# Patient Record
Sex: Male | Born: 1965 | Race: White | Hispanic: No | Marital: Married | Smoking: Current every day smoker
Health system: Southern US, Community
[De-identification: ages and names within clinical notes are randomized; demographics above are authoritative.]

## PROBLEM LIST (undated history)

## (undated) DIAGNOSIS — G4730 Sleep apnea, unspecified: Secondary | ICD-10-CM

## (undated) DIAGNOSIS — C801 Malignant (primary) neoplasm, unspecified: Secondary | ICD-10-CM

## (undated) DIAGNOSIS — R12 Heartburn: Secondary | ICD-10-CM

## (undated) DIAGNOSIS — R972 Elevated prostate specific antigen [PSA]: Secondary | ICD-10-CM

## (undated) DIAGNOSIS — E785 Hyperlipidemia, unspecified: Secondary | ICD-10-CM

## (undated) DIAGNOSIS — I10 Essential (primary) hypertension: Secondary | ICD-10-CM

## (undated) DIAGNOSIS — F17200 Nicotine dependence, unspecified, uncomplicated: Secondary | ICD-10-CM

## (undated) DIAGNOSIS — G709 Myoneural disorder, unspecified: Secondary | ICD-10-CM

## (undated) DIAGNOSIS — F5104 Psychophysiologic insomnia: Secondary | ICD-10-CM

## (undated) DIAGNOSIS — G4710 Hypersomnia, unspecified: Secondary | ICD-10-CM

## (undated) DIAGNOSIS — K449 Diaphragmatic hernia without obstruction or gangrene: Secondary | ICD-10-CM

## (undated) DIAGNOSIS — K2970 Gastritis, unspecified, without bleeding: Secondary | ICD-10-CM

## (undated) DIAGNOSIS — R7303 Prediabetes: Secondary | ICD-10-CM

## (undated) DIAGNOSIS — K219 Gastro-esophageal reflux disease without esophagitis: Secondary | ICD-10-CM

## (undated) DIAGNOSIS — Z8719 Personal history of other diseases of the digestive system: Secondary | ICD-10-CM

## (undated) DIAGNOSIS — K589 Irritable bowel syndrome without diarrhea: Secondary | ICD-10-CM

## (undated) HISTORY — DX: Heartburn: R12

## (undated) HISTORY — DX: Irritable bowel syndrome, unspecified: K58.9

## (undated) HISTORY — PX: OTHER SURGICAL HISTORY: SHX169

## (undated) HISTORY — DX: Psychophysiologic insomnia: F51.04

## (undated) HISTORY — DX: Diaphragmatic hernia without obstruction or gangrene: K44.9

## (undated) HISTORY — DX: Gastro-esophageal reflux disease without esophagitis: K21.9

## (undated) HISTORY — PX: WISDOM TOOTH EXTRACTION: SHX21

## (undated) HISTORY — DX: Sleep apnea, unspecified: G47.30

## (undated) HISTORY — DX: Hyperlipidemia, unspecified: E78.5

## (undated) HISTORY — DX: Hypersomnia, unspecified: G47.10

## (undated) HISTORY — PX: PROSTATE BIOPSY: SHX241

## (undated) HISTORY — DX: Gastritis, unspecified, without bleeding: K29.70

---

## 1995-01-12 HISTORY — PX: OTHER SURGICAL HISTORY: SHX169

## 2000-10-31 ENCOUNTER — Encounter: Payer: Self-pay | Admitting: Emergency Medicine

## 2000-10-31 ENCOUNTER — Emergency Department (HOSPITAL_COMMUNITY): Admission: EM | Admit: 2000-10-31 | Discharge: 2000-10-31 | Payer: Self-pay | Admitting: Emergency Medicine

## 2001-04-10 ENCOUNTER — Encounter (INDEPENDENT_AMBULATORY_CARE_PROVIDER_SITE_OTHER): Payer: Self-pay | Admitting: *Deleted

## 2001-04-10 ENCOUNTER — Ambulatory Visit (HOSPITAL_BASED_OUTPATIENT_CLINIC_OR_DEPARTMENT_OTHER): Admission: RE | Admit: 2001-04-10 | Discharge: 2001-04-10 | Payer: Self-pay | Admitting: Surgery

## 2007-07-31 ENCOUNTER — Encounter: Payer: Self-pay | Admitting: Internal Medicine

## 2007-10-25 ENCOUNTER — Encounter: Payer: Self-pay | Admitting: Internal Medicine

## 2008-10-22 ENCOUNTER — Encounter: Payer: Self-pay | Admitting: Internal Medicine

## 2008-10-24 ENCOUNTER — Encounter: Payer: Self-pay | Admitting: Internal Medicine

## 2009-02-13 ENCOUNTER — Encounter: Payer: Self-pay | Admitting: Internal Medicine

## 2009-02-18 ENCOUNTER — Encounter: Payer: Self-pay | Admitting: Internal Medicine

## 2009-04-08 ENCOUNTER — Ambulatory Visit: Payer: Self-pay | Admitting: Internal Medicine

## 2009-04-08 DIAGNOSIS — R5383 Other fatigue: Secondary | ICD-10-CM

## 2009-04-08 DIAGNOSIS — E785 Hyperlipidemia, unspecified: Secondary | ICD-10-CM

## 2009-04-08 DIAGNOSIS — R5381 Other malaise: Secondary | ICD-10-CM

## 2009-04-08 DIAGNOSIS — F17200 Nicotine dependence, unspecified, uncomplicated: Secondary | ICD-10-CM

## 2009-04-24 ENCOUNTER — Telehealth (INDEPENDENT_AMBULATORY_CARE_PROVIDER_SITE_OTHER): Payer: Self-pay | Admitting: *Deleted

## 2009-05-15 ENCOUNTER — Ambulatory Visit: Payer: Self-pay

## 2009-05-15 ENCOUNTER — Ambulatory Visit: Payer: Self-pay | Admitting: Internal Medicine

## 2009-05-15 ENCOUNTER — Ambulatory Visit: Payer: Self-pay | Admitting: Pulmonary Disease

## 2009-05-15 DIAGNOSIS — G4730 Sleep apnea, unspecified: Secondary | ICD-10-CM

## 2009-05-15 DIAGNOSIS — G4710 Hypersomnia, unspecified: Secondary | ICD-10-CM

## 2009-06-16 ENCOUNTER — Ambulatory Visit (HOSPITAL_BASED_OUTPATIENT_CLINIC_OR_DEPARTMENT_OTHER): Admission: RE | Admit: 2009-06-16 | Discharge: 2009-06-16 | Payer: Self-pay | Admitting: Pulmonary Disease

## 2009-06-16 ENCOUNTER — Encounter: Payer: Self-pay | Admitting: Pulmonary Disease

## 2009-06-20 ENCOUNTER — Ambulatory Visit: Payer: Self-pay | Admitting: Pulmonary Disease

## 2009-06-30 ENCOUNTER — Ambulatory Visit: Payer: Self-pay | Admitting: Pulmonary Disease

## 2009-10-17 ENCOUNTER — Telehealth: Payer: Self-pay | Admitting: Pulmonary Disease

## 2010-02-10 NOTE — Assessment & Plan Note (Signed)
Summary: rov- 2 wks after slp study per rc //kp   Visit Type:  Follow-up Copy to:  Dr. Gala Romney Primary Provider/Referring Provider:  Dr Tenny Craw @ [facility]  CC:  Pt here for follow up after sleep study. Pt states is on an antibiotice for ingrown toenail and will finish same on tomorrow.  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 He tosses and turns all night. Snores heavily and wife kicks him out of bed most nights. Wife has noted that  he often stops breathing. He drives 4-500 miles/wk. Epworth Sleepiness Score is 16. On weekends he naps x 1 hr, non refreshing.  June 30, 2009 3:06 PM  reviewed PSG , wt 200 lbs >>mild obstructive sleep apnea with [facility] 12/h , predominant hypopneas in supine position, non supine AHI was 2/h (59 mins) , sleep fragmentation due to multiple spontaneous arousals.  Preventive Screening-Counseling & Management  Alcohol-Tobacco     Smoking Status: current  Current Medications (verified): 1)  Simvastatin 40 Mg Tabs (Simvastatin) .... Take One Tablet By Mouth Daily At Bedtime 2)  Omeprazole 20 Mg Cpdr (Omeprazole) .... Once Daily 3)  Multivitamins   Tabs (Multiple Vitamin) .... Once Daily 4)  Claritin 10 Mg Tabs (Loratadine) .... 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  1/2 pdd x 20 yrs Alcohol Use - yes Regular Exercise - no Drug Use - yes --- 10+ yrs ago cocaine ( very few times) Married Children-yes 1  Review of Systems  The patient denies anorexia, fever, weight loss, weight gain, vision loss, decreased hearing, hoarseness, chest pain, syncope, dyspnea on exertion,  peripheral edema, prolonged cough, headaches, hemoptysis, abdominal pain, melena, hematochezia, severe indigestion/heartburn, hematuria, muscle weakness, suspicious skin lesions, difficulty walking, depression, unusual weight change, and abnormal bleeding.    Vital Signs:  Patient profile:   45 year old male Height:      68 inches Weight:      200.5 pounds BMI:     30.60 O2 Sat:      97 % on Room air Temp:     98.2 degrees F oral Pulse rate:   90 / minute BP sitting:   112 / 84  (left arm) Cuff size:   regular  Vitals Entered By: Zackery Barefoot CMA (June 30, 2009 2:48 PM)  O2 Flow:  Room air CC: Pt here for follow up after sleep study. Pt states is on an antibiotice for ingrown toenail and will finish same on tomorrow Comments Medications reviewed with patient Verified contact number and pharmacy with patient Zackery Barefoot CMA  June 30, 2009 2:49 PM    Physical Exam  Additional Exam:  Gen. Pleasant, well-nourished, in no distress, normal affect ENT - no lesions, no post nasal drip, class 3 airway Neck: No JVD, no thyromegaly, no carotid bruits Lungs: no use of accessory muscles, no dullness to percussion, clear without rales or rhonchi  Cardiovascular: Rhythm regular, heart sounds  normal, no murmurs or gallops, no peripheral edema Musculoskeletal: No deformities, no cyanosis or clubbing      Impression & Recommendations:  Problem # 1:  HYPERSOMNIA, ASSOCIATED WITH SLEEP APNEA (ICD-780.53) The pathophysiology of obstructive sleep apnea, it's  cardiovascular consequences and modes of treatment including CPAP were discussed with the patient in great detail.  Discussed options including positional device, CPAP & oral appliance Clearly weight loss should be primar goal. he will try positional device & we can rpt home study on this & review for symptoms in 1 month Orders: DME Referral (DME) Est. Patient Level III (81191)  Problem # 2:  FATIGUE (ICD-780.79) melatonin at  bedtime   Patient Instructions: 1)  Copy sent to: 2)  Please schedule a follow-up appointment in 6  weeks. 3)  Trial of melatonin upto 5 mg 2 hrs before bedtime 4)  Discuss co-pays on CPAP with PCC 5)  trial of sleeping device - [email or web address]

## 2010-02-10 NOTE — Progress Notes (Signed)
  Faxed ROI over to [facility] today to fax [phone] [facility]  April 24, 2009 10:20 AM    Appended Document:  Recieved Records back from [facility] @ [facility] gave to [employer]

## 2010-02-10 NOTE — Letter (Signed)
Summary: [facility] Office Note  [facility] Office Note   Imported By: Roderic Ovens 06/02/2009 11:35:07  _____________________________________________________________________  External Attachment:    Type:   Image     Comment:   External Document

## 2010-02-10 NOTE — Progress Notes (Signed)
----   Converted from flag ---- ---- 07/08/2009 2:21 PM, Boone Master CNA/MA wrote: called spoke with patient.  he states that he has not tried the positional device and is waiting until he gets more information from his insurance on what is covered, etc before making a decision on whether or not to move forward.  pt states also that when he talked to [facility] regarding co-pays on CPAP he was told that most insurances pay about 80%, but not knowing the "80% of what" is holding him back.    ---- 07/08/2009 11:11 AM, Boone Master CNA/MA wrote: [facility] @ work # [employer].  gave pt [employer] and [facility] office numbers.  ---- 07/08/2009 11:02 AM, Comer Locket. Vassie Loll MD wrote: can u find out he has obtained the positional device ? ------------------------------

## 2010-02-10 NOTE — Assessment & Plan Note (Signed)
Summary: np eval fx of heart disease   Primary Provider:  Dr Tenny Craw @ [facility]  CC:  lipids.  History of Present Illness: 45 y/o male with h/o hyperlipidemia and tobacco use. Referred for a baseline cardiology evaluation.  No known CAD. has never had stress test or cath.  Not very active. 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 Denies any exertional CP or SOB. Has been smoking about 1/2 ppd x 20 years. Has not tried to quit. Main complaint is that he feels tired. has problems sleeping - says "I can't sleep at all." Toss and turns all night. Snores heavily and wife kicks him out of bed most nights. Wife says he often stops breathing.  Occasional brief palpitations. No syncope or presynocpe. Has been on simvastatin for nearly 2 years.     Preventive Screening-Counseling & Management  Alcohol-Tobacco     Smoking Status: current  Caffeine-Diet-Exercise     Does Patient Exercise: no      Drug Use:  yes.    Current Medications (verified): 1)  Simvastatin 40 Mg Tabs (Simvastatin) .... Take One Tablet By Mouth Daily At Bedtime 2)  Omeprazole 20 Mg Cpdr (Omeprazole) .... Once Daily 3)  Multivitamins   Tabs (Multiple Vitamin) .... Once Daily  Allergies (verified): No Known Drug Allergies  Past History:  Past Medical History: Hyperlipidemia Tobacco user Insomina GERD  Family History: Reviewed history from 04/04/2009 and no changes required. Family History of Diabetes:  Family History of Hyperlipidemia:  M  alive 25 good shape F  died 74. Multiple problems. Possible MI 1B 42 fine  Social History: Reviewed history and no changes required. Full Time Tobacco Use - Yes.  1/2 pdd x 20 yrs Alcohol Use - yes Regular Exercise - no Drug Use - yes --- 10+ yrs ago cocaine ( very few times) Smoking Status:  current Does Patient Exercise:  no Drug Use:  yes  Review of Systems       As per HPI and past medical history; otherwise all systems  negative.   Vital Signs:  Patient profile:   46 year old male Height:      68 inches Weight:      203 pounds BMI:     30.98 Pulse rate:   68 / minute BP sitting:   120 / 86  (left arm) Cuff size:   regular  Vitals Entered By: Hardin Negus, RMA (April 08, 2009 9:14 AM)  Physical Exam  General:  Gen: well appearing. no resp difficulty HEENT: normal. Mallinpati 3 Neck: supple. no JVD. Carotids 2+ bilat; no bruits. No lymphadenopathy or thryomegaly appreciated. Cor: PMI nondisplaced. Regular rate & rhythm. No rubs, gallops, murmur. Lungs: clear Abdomen: soft, nontender, nondistended. No hepatosplenomegaly. No bruits or masses. Good bowel sounds. Extremities: no cyanosis, clubbing, rash, edema Neuro: alert & orientedx3, cranial nerves grossly intact. moves all 4 extremities w/o difficulty. affect pleasant    Problems:  Medical Problems Added: 1)  Dx of Tobacco User  (ICD-305.1) 2)  Dx of Screening For Ischemic Heart Disease  (ICD-V81.0) 3)  Dx of Fatigue  (ICD-780.79) 4)  Dx of Fatigue / Malaise  (ICD-780.79) 5)  Dx of Hyperlipidemia-mixed  (ICD-272.4)  Impression & Recommendations:  Problem # 1:  SCREENING FOR ISCHEMIC HEART DISEASE (ICD-V81.0) Has multiple risk factors which we discussed but no overt symptoms. Will proceed with screening exercise test.  Problem # 2:  FATIGUE (ICD-780.79) Almost certainly has severe sleep apnea. We discussed pathophyisology of this and  increased risk of CV disease associated with it. Refert for sleep study.  Problem # 3:  TOBACCO USER (ICD-305.1) Counseled on need to stop smoking.  Other Orders: EKG w/ Interpretation (93000) Treadmill (Treadmill) Pulmonary Referral (Pulmonary)  Patient Instructions: 1)  You have been referred to Pulmonary 2)  Your physician has requested that you have an exercise tolerance test.  For further information please visit [email or web address]  Please also follow instruction sheet, as given. 3)  Follow  up in 3-4 months

## 2010-02-10 NOTE — Letter (Signed)
Summary: [facility]  [facility]   Imported By: Roderic Ovens 06/02/2009 11:28:51  _____________________________________________________________________  External Attachment:    Type:   Image     Comment:   External Document

## 2010-02-10 NOTE — Letter (Signed)
Summary: [facility]  [facility]   Imported By: Roderic Ovens 04/14/2009 15:54:47  _____________________________________________________________________  External Attachment:    Type:   Image     Comment:   External Document

## 2010-02-10 NOTE — Letter (Signed)
[facility]  [facility]   Imported By: Roderic Ovens 04/14/2009 15:57:00  _____________________________________________________________________  External Attachment:    Type:   Image     Comment:   External Document

## 2010-02-10 NOTE — Letter (Signed)
Summary: [facility]  [facility]   Imported By: Roderic Ovens 06/02/2009 11:29:46  _____________________________________________________________________  External Attachment:    Type:   Image     Comment:   External Document

## 2010-02-10 NOTE — Assessment & Plan Note (Signed)
Summary: sleep eval/apc   Visit Type:  Initial Consult Copy to:  Dr. Gala Romney Primary Provider/Referring Provider:  Dr Tenny Craw @ [facility]  CC:  Pt here for sleep consult.  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 He tosses and turns all night. Snores heavily and wife kicks him out of bed most nights. Wife has noted that  he often stops breathing. He drives 4-500 miles/wk. Epworth Sleepiness Score is 16. On weekends he naps x 1 hr, non refreshing.  Preventive Screening-Counseling & Management  Alcohol-Tobacco     Alcohol drinks/day: 2     Alcohol type: beer     Smoking Status: current     Packs/Day: 1.0     Year Started: 1995   History of Present Illness: Can only sleep for a short time  What time do you typically go to bed?(between what hours): 10pm -11pm  How long does it take you to fall asleep? no time  How many times during the night do you wake up? many at least 3 or 4  What time do you get out of bed to start your day? 7am, tired, dry mouth  Do you drive or operate heavy machinery in your occupation? 400 to 500 miles per week in car for work  How much has your weight changed (up or down) over the past two years? (in pounds): 5 lb increase  Have you ever had a sleep study before?  If yes,when and where: no  Do you currently use CPAP ? If so , at what pressure? no  Do you wear oxygen at any time? If yes, how many liters per minute? no Current Medications (verified): 1)  Simvastatin 40 Mg Tabs (Simvastatin) .... Take One Tablet By Mouth Daily At Bedtime 2)  Omeprazole 20 Mg Cpdr (Omeprazole) .... Once Daily 3)  Multivitamins   Tabs (Multiple Vitamin) .... Once Daily 4)  Claritin 10 Mg Tabs (Loratadine) .... 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  1/2 pdd x 20 yrs Alcohol Use - yes Regular Exercise - no Drug Use - yes --- 10+ yrs ago cocaine ( very few times) Married Children-yes 1Packs/Day:  1.0 Alcohol drinks/day:  2  Review of Systems       The patient complains of productive cough, acid heartburn, indigestion, weight change, nasal congestion/difficulty breathing through nose, sneezing, itching, anxiety, and joint stiffness or pain.  The patient denies shortness of breath with activity, shortness of breath at rest, non-productive cough, coughing up blood, chest pain, irregular heartbeats, loss of appetite, abdominal pain, difficulty swallowing, sore throat, tooth/dental problems, headaches, ear ache, depression, hand/feet swelling, rash, change in color of mucus, and fever.    Vital Signs:  Patient profile:   45 year old male Height:      68 inches Weight:      197 pounds O2 Sat:      97 % on Room air Temp:     97.9 degrees F oral Pulse rate:   91 / minute BP sitting:   116 / 74  (right arm) Cuff size:   regular  Vitals Entered By: Zackery Barefoot CMA (May 15, 2009 10:14 AM)  O2 Flow:  Room air CC: Pt here  for sleep consult Comments Medications reviewed with patient Verified contact number and pharmacy with patient Zackery Barefoot CMA  May 15, 2009 10:14 AM    Physical Exam  Additional Exam:  Gen. Pleasant, well-nourished, in no distress, normal affect ENT - no lesions, no post nasal drip, class 3 airway Neck: No JVD, no thyromegaly, no carotid bruits Lungs: no use of accessory muscles, no dullness to percussion, clear without rales or rhonchi  Cardiovascular: Rhythm regular, heart sounds  normal, no murmurs or gallops, no peripheral edema Abdomen: soft and non-tender, no hepatosplenomegaly, BS normal. Musculoskeletal: No deformities, no cyanosis or clubbing Neuro:  alert, non focal     Impression &  Recommendations:  Problem # 1:  HYPERSOMNIA, ASSOCIATED WITH SLEEP APNEA (ICD-780.53) The pathophysiology of obstructive sleep apnea, it's cardiovascular consequences and modes of treatment including CPAP were discussed with the patient in great detail.  Given excessive daytime somnolence, loud snoring, witnessed apneas & narrow pharyngeal exam, obstructive sleep apnea is very likley & a split study willbe scheduled. Orders: Consultation Level III (04540) Sleep Disorder Referral (Sleep Disorder)  Medications Added to Medication List This Visit: 1)  Claritin 10 Mg Tabs (Loratadine) .... Take 1 tablet by mouth once a day as needed seasonal  Patient Instructions: 1)  Copy sent to: dr Gala Romney 2)  Please schedule a follow-up appointment in 2 weeks after sleep study

## 2012-03-22 ENCOUNTER — Other Ambulatory Visit: Payer: Self-pay | Admitting: Gastroenterology

## 2012-03-27 ENCOUNTER — Other Ambulatory Visit: Payer: Self-pay

## 2012-03-27 ENCOUNTER — Encounter (HOSPITAL_COMMUNITY): Payer: Self-pay

## 2012-03-27 ENCOUNTER — Ambulatory Visit (HOSPITAL_COMMUNITY)
Admission: RE | Admit: 2012-03-27 | Discharge: 2012-03-27 | Disposition: A | Discharge status: Home / Self Care | Payer: 59 | Source: Ambulatory Visit | Attending: Gastroenterology | Admitting: Gastroenterology

## 2012-03-27 DIAGNOSIS — K5730 Diverticulosis of large intestine without perforation or abscess without bleeding: Secondary | ICD-10-CM | POA: Insufficient documentation

## 2012-03-27 DIAGNOSIS — R197 Diarrhea, unspecified: Secondary | ICD-10-CM | POA: Insufficient documentation

## 2012-03-27 DIAGNOSIS — R1031 Right lower quadrant pain: Secondary | ICD-10-CM | POA: Insufficient documentation

## 2012-03-27 MED ORDER — IOHEXOL 300 MG/ML  SOLN
100.0000 mL | Freq: Once | INTRAMUSCULAR | Status: AC | PRN
  Administered 2012-03-27: 100 mL via INTRAVENOUS

## 2013-01-31 ENCOUNTER — Encounter: Payer: Self-pay | Admitting: Cardiology

## 2013-01-31 ENCOUNTER — Encounter: Payer: Self-pay | Admitting: *Deleted

## 2013-02-01 ENCOUNTER — Encounter: Payer: Self-pay | Admitting: Cardiology

## 2013-02-01 ENCOUNTER — Ambulatory Visit (INDEPENDENT_AMBULATORY_CARE_PROVIDER_SITE_OTHER): Payer: 59 | Admitting: Cardiology

## 2013-02-01 VITALS — BP 138/90 | HR 78 | Ht 68.0 in | Wt 203.0 lb

## 2013-02-01 DIAGNOSIS — R079 Chest pain, unspecified: Secondary | ICD-10-CM

## 2013-02-01 DIAGNOSIS — F17200 Nicotine dependence, unspecified, uncomplicated: Secondary | ICD-10-CM

## 2013-02-01 DIAGNOSIS — E785 Hyperlipidemia, unspecified: Secondary | ICD-10-CM

## 2013-02-01 NOTE — Assessment & Plan Note (Addendum)
Management per primary care. 

## 2013-02-01 NOTE — Assessment & Plan Note (Signed)
Patient counseled on discontinuing. 

## 2013-02-01 NOTE — Progress Notes (Signed)
     HPI: 48 year old male for evaluation of chest pain. Exercise treadmill in May 2011 negative. Patient has occasional indigestion that improves with Prilosec. This can occur after eating. He denies dyspnea on exertion, orthopnea, PND, pedal edema, syncope or exertional chest pain.  Current Outpatient Prescriptions  Medication Sig Dispense Refill  . aspirin 81 MG tablet Take 81 mg by mouth daily.      . Omega-3 Fatty Acids (FISH OIL PO) Take 1 tablet by mouth daily.      . temazepam (RESTORIL) 15 MG capsule Take 15 mg by mouth at bedtime as needed for sleep.      Marland Kitchen. omeprazole (PRILOSEC) 40 MG capsule Take 2 capsules by mouth daily.       No current facility-administered medications for this visit.    Not on File  Past Medical History  Diagnosis Date  . HYPERLIPIDEMIA-MIXED   . HYPERSOMNIA, ASSOCIATED WITH SLEEP APNEA   . GERD (gastroesophageal reflux disease)     Past Surgical History  Procedure Laterality Date  . Athroscopic knee surgery      History   Social History  . Marital Status: Married    Spouse Name: N/A    Number of Children: 1  . Years of Education: N/A   Occupational History  . Not on file.   Social History Main Topics  . Smoking status: Current Every Day Smoker  . Smokeless tobacco: Not on file  . Alcohol Use: Yes     Comment: Case and half beer per week  . Drug Use: No  . Sexual Activity: Not on file   Other Topics Concern  . Not on file   Social History Narrative  . No narrative on file    Family History  Problem Relation Age of Onset  . CAD Father     MI at age 47    ROS: no fevers or chills, productive cough, hemoptysis, dysphasia, odynophagia, melena, hematochezia, dysuria, hematuria, rash, seizure activity, orthopnea, PND, pedal edema, claudication. Remaining systems are negative.  Physical Exam:   Blood pressure 138/90, pulse 78, height 5\' 8"  (1.727 m), weight 203 lb (92.08 kg).  General:  Well developed/well nourished in  NAD Skin warm/dry Patient not depressed No peripheral clubbing Back-normal HEENT-normal/normal eyelids Neck supple/normal carotid upstroke bilaterally; no bruits; no JVD; no thyromegaly chest - CTA/ normal expansion CV - RRR/normal S1 and S2; no murmurs, rubs or gallops;  PMI nondisplaced Abdomen -NT/ND, no HSM, no mass, + bowel sounds, no bruit 2+ femoral pulses, no bruits Ext-no edema, chords, 2+ DP Neuro-grossly nonfocal  ECG NSR with no ST changes.

## 2013-02-01 NOTE — Assessment & Plan Note (Signed)
Symptoms most consistent with reflux. I have recommended an exercise program long-term. We will plan an exercise treadmill prior to initiation.

## 2013-02-01 NOTE — Patient Instructions (Signed)
Your physician recommends that you schedule a follow-up appointment in: AS NEEDED  Your physician has requested that you have an exercise tolerance test. For further information please visit [email or web address]. Please also follow instruction sheet, as given.    

## 2013-02-07 ENCOUNTER — Ambulatory Visit (HOSPITAL_COMMUNITY)
Admission: RE | Admit: 2013-02-07 | Discharge: 2013-02-07 | Disposition: A | Discharge status: Home / Self Care | Payer: 59 | Source: Ambulatory Visit | Attending: Cardiology | Admitting: Cardiology

## 2013-02-07 DIAGNOSIS — R079 Chest pain, unspecified: Secondary | ICD-10-CM

## 2013-03-09 ENCOUNTER — Telehealth: Payer: Self-pay | Admitting: Cardiology

## 2013-03-09 NOTE — Telephone Encounter (Signed)
Left message for pt of normal GXT results.

## 2013-03-09 NOTE — Telephone Encounter (Signed)
New message          Pt would like stress test results from end of January 2015

## 2013-04-13 ENCOUNTER — Encounter: Payer: Self-pay | Admitting: *Deleted

## 2013-04-17 ENCOUNTER — Encounter: Payer: Self-pay | Admitting: Neurology

## 2013-04-17 ENCOUNTER — Ambulatory Visit (INDEPENDENT_AMBULATORY_CARE_PROVIDER_SITE_OTHER): Payer: 59 | Admitting: Neurology

## 2013-04-17 ENCOUNTER — Encounter (INDEPENDENT_AMBULATORY_CARE_PROVIDER_SITE_OTHER): Payer: Self-pay

## 2013-04-17 VITALS — BP 120/86 | HR 87 | Ht 68.25 in | Wt 201.0 lb

## 2013-04-17 DIAGNOSIS — F5104 Psychophysiologic insomnia: Secondary | ICD-10-CM | POA: Insufficient documentation

## 2013-04-17 DIAGNOSIS — R0989 Other specified symptoms and signs involving the circulatory and respiratory systems: Secondary | ICD-10-CM

## 2013-04-17 DIAGNOSIS — R0609 Other forms of dyspnea: Secondary | ICD-10-CM

## 2013-04-17 DIAGNOSIS — G4700 Insomnia, unspecified: Secondary | ICD-10-CM

## 2013-04-17 DIAGNOSIS — R0683 Snoring: Secondary | ICD-10-CM

## 2013-04-17 MED ORDER — SUVOREXANT 10 MG PO TABS: 10.0000 mg | ORAL_TABLET | Freq: Every evening | ORAL | Status: DC

## 2013-04-17 NOTE — Patient Instructions (Addendum)
Belsomra  Is a new insomnia medication, please review under [employer]  Keep a sleep diary .  You will have a home sleep test this month, our office will contact you. Insomnia Insomnia is frequent trouble falling and/or staying asleep. Insomnia can be a long term problem or a short term problem. Both are common. Insomnia can be a short term problem when the wakefulness is related to a certain stress or worry. Long term insomnia is often related to ongoing stress during waking hours and/or poor sleeping habits. Overtime, sleep deprivation itself can make the problem worse. Every little thing feels more severe because you are overtired and your ability to cope is decreased. CAUSES   Stress, anxiety, and depression.  Poor sleeping habits.  Distractions such as TV in the bedroom.  Naps close to bedtime.  Engaging in emotionally charged conversations before bed.  Technical reading before sleep.  Alcohol and other sedatives. They may make the problem worse. They can hurt normal sleep patterns and normal dream activity.  Stimulants such as caffeine for several hours prior to bedtime.  Pain syndromes and shortness of breath can cause insomnia.  Exercise late at night.  Changing time zones may cause sleeping problems (jet lag). It is sometimes helpful to have someone observe your sleeping patterns. They should look for periods of not breathing during the night (sleep apnea). They should also look to see how long those periods last. If you live alone or observers are uncertain, you can also be observed at a sleep clinic where your sleep patterns will be professionally monitored. Sleep apnea requires a checkup and treatment. Give your caregivers your medical history. Give your caregivers observations your family has made about your sleep.  SYMPTOMS   Not feeling rested in the morning.  Anxiety and restlessness at bedtime.  Difficulty falling and staying asleep. TREATMENT   Your  caregiver may prescribe treatment for an underlying medical disorders. Your caregiver can give advice or help if you are using alcohol or other drugs for self-medication. Treatment of underlying problems will usually eliminate insomnia problems.  Medications can be prescribed for short time use. They are generally not recommended for lengthy use.  Over-the-counter sleep medicines are not recommended for lengthy use. They can be habit forming.  You can promote easier sleeping by making lifestyle changes such as:  Using relaxation techniques that help with breathing and reduce muscle tension.  Exercising earlier in the day.  Changing your diet and the time of your last meal. No night time snacks.  Establish a regular time to go to bed.  Counseling can help with stressful problems and worry.  Soothing music and white noise may be helpful if there are background noises you cannot remove.  Stop tedious detailed work at least one hour before bedtime. HOME CARE INSTRUCTIONS   Keep a diary. Inform your caregiver about your progress. This includes any medication side effects. See your caregiver regularly. Take note of:  Times when you are asleep.  Times when you are awake during the night.  The quality of your sleep.  How you feel the next day. This information will help your caregiver care for you.  Get out of bed if you are still awake after 15 minutes. Read or do some quiet activity. Keep the lights down. Wait until you feel sleepy and go back to bed.  Keep regular sleeping and waking hours. Avoid naps.  Exercise regularly.  Avoid distractions at bedtime. Distractions include watching television or engaging  in any intense or detailed activity like attempting to balance the household checkbook.  Develop a bedtime ritual. Keep a familiar routine of bathing, brushing your teeth, climbing into bed at the same time each night, listening to soothing music. Routines increase the success  of falling to sleep faster.  Use relaxation techniques. This can be using breathing and muscle tension release routines. It can also include visualizing peaceful scenes. You can also help control troubling or intruding thoughts by keeping your mind occupied with boring or repetitive thoughts like the old concept of counting sheep. You can make it more creative like imagining planting one beautiful flower after another in your backyard garden.  During your day, work to eliminate stress. When this is not possible use some of the previous suggestions to help reduce the anxiety that accompanies stressful situations. MAKE SURE YOU:   Understand these instructions.  Will watch your condition.  Will get help right away if you are not doing well or get worse. Document Released: 12/26/1999 Document Revised: 03/22/2011 Document Reviewed: 01/25/2007 [facility] Patient Information 2014 [employer], [place].

## 2013-04-17 NOTE — Progress Notes (Signed)
[facility]  Provider:  Melvyn Novas, M D  Referring Provider: Duane Lope, MD Primary Care Physician:   Duane Lope, MD  Chief Complaint  Patient presents with  . New Evaluation    Room 11  . Insomnia    HPI:  Jason Price is a 48 y.o. male  Is seen here as a referral   from Dr. Tenny Craw for evaluation of chronic insomnia.   Mr . Spickler reports having had trouble to initialize or sustain sleep.  He has been having this problem for years, waxing and waning , but never in the last 2 years was there a whole week of 'easy sleep " . Reports that he does not have a specific sleep routine, it is desired time to order that is around 10 PM if he can make it, will follow sleep initially while the promptly but then wakes up frequently through the night and in the and feels that her sleep was neither refreshing nor restoring him. He reports that he wakes up in the early morning hours, between 3 AM and 3:30 AM and that his legs at the time started hurting. This discomfort seems to affect the left side of the body more.  He describes his left side at aching and feeling sore and heavy but not in the character of her restless leg syndrome. He also is noted to snore, and he states his wife makes  comments about this. He is unsure if there is a positional component. His wife has witnessed apneas but when the patient underwent a sleep study this could not be confirmed. However his un-inability to sustain sleep and to get restful restorative sleep has affected him. He feels very tired. The patient uses a turns giving worn-out and be, indicating that it has affected his life quality and his performance at work. His nightly sleep 5 hours of sleep, but he craves 7 hours, and needed this to feel good.  He feels anxious and suspected that anxiety may play a role in his early morning . He has no Nocturia , he stopped drinking ETOH late in PM and he discontinued caffeine . He sleeps not restless, he  just doesn't sleep long.   He endorsed the fatigue severity score at 50 points in the Epworth sleepiness score at 8 points.  The patient reports that he tried to mother, 50 mg which in his first night of use gait and good sleep but seems not to work  Anymore. Unisom works , Nyquil help but he is very 'drugged ' in the morning.  He had a polysomnography evaluation about 4 years ago at the [facility] and was told that she did not have significant apnea or purulence. He said his wife was and his belief about the results. She still is.   Review of Systems: Out of a complete 14 system review, the patient complains of only the following symptoms, and all other reviewed systems are negative. FSS 50 and Epworth  8.  History   Social History  . Marital Status: Married    Spouse Name: Carollee Herter    Number of Children: 1  . Years of Education: 12   Occupational History  . Not on file.   Social History Main Topics  . Smoking status: Current Every Day Smoker -- 0.50 packs/day for 8 years    Types: Cigarettes  . Smokeless tobacco: Former Neurosurgeon    Types: Snuff  . Alcohol Use: Yes  Comment: 18 a beers- weekly  . Drug Use: No  . Sexual Activity: Not on file   Other Topics Concern  . Not on file   Social History Narrative   Patient is married Carollee Herter([name]).   Patient has one child.   Patient does not drink any caffeine.   Patient is a Production designer, theatre/television/filmmanager.   Patient has a high school education.   Patient is right-handed.             Family History  Problem Relation Age of Onset  . CAD Father     MI at age 47  . Alcoholism Father   . Heart attack Father   . Diabetes Father   . Hypertension Mother   . Insomnia Mother   . Breast cancer Maternal Aunt     Past Medical History  Diagnosis Date  . HYPERLIPIDEMIA-MIXED   . HYPERSOMNIA, ASSOCIATED WITH SLEEP APNEA   . GERD (gastroesophageal reflux disease)   . Hiatal hernia   . Gastritis   . IBS (irritable bowel syndrome)   . Heartburn    . Chronic insomnia     Past Surgical History  Procedure Laterality Date  . Athroscopic knee surgery      Current Outpatient Prescriptions  Medication Sig Dispense Refill  . simvastatin (ZOCOR) 20 MG tablet Take 20 mg by mouth daily.      Marland Kitchen. aspirin 81 MG tablet Take 81 mg by mouth daily.      . Omega-3 Fatty Acids (FISH OIL PO) Take 1 tablet by mouth 2 (two) times daily.       Marland Kitchen. omeprazole (PRILOSEC) 40 MG capsule Take 2 capsules by mouth daily.      . temazepam (RESTORIL) 15 MG capsule Take 15 mg by mouth at bedtime as needed for sleep.       No current facility-administered medications for this visit.    Allergies as of 04/17/2013 - Review Complete 04/17/2013  Allergen Reaction Noted  . Niaspan [niacin er]  04/13/2013  . Simvastatin  04/13/2013    Vitals: BP 120/86  Pulse 87  Ht 5' 8.25" (1.734 m)  Wt 201 lb (91.173 kg)  BMI 30.32 kg/m2 Last Weight:  Wt Readings from Last 1 Encounters:  04/17/13 201 lb (91.173 kg)   Last Height:   Ht Readings from Last 1 Encounters:  04/17/13 5' 8.25" (1.734 m)    Physical exam:  General: The patient is awake, alert and appears not in acute distress. The patient is well groomed. Head: Normocephalic, atraumatic. Neck is supple. Mallampati 3 , neck circumference: 17,  Narrow jaw dentition.  Cardiovascular:  Regular rate and rhythm  without  murmurs or carotid bruit, and without distended neck veins. Respiratory: Lungs are clear to auscultation. Skin:  Without evidence of edema, or rash Trunk: BMI is  elevated and patient has normal posture.  Neurologic exam : The patient is awake and alert, oriented to place and time.  Memory subjective described as intact. There is a normal attention span & concentration ability. Speech is fluent without  dysarthria, dysphonia or aphasia. Mood and affect are appropriate.  Cranial nerves: Pupils are equal and briskly reactive to light. Funduscopic exam without  evidence of pallor or edema.  Extraocular movements  in vertical and horizontal planes intact and without nystagmus.  The patient has ptosis on the right side. Visual fields by finger perimetry are intact. Hearing to finger rub intact.  Facial sensation intact to fine touch. Facial motor strength is symmetric and tongue  and uvula move midline.  Motor exam:  Normal tone and normal muscle bulk and symmetric normal strength in all extremities.  Sensory:  Fine touch, pinprick and vibration were tested in all extremities. Proprioception is normal.  Coordination: Rapid alternating movements in the fingers/hands is tested and normal. Finger-to-nose maneuver tested and normal without evidence of ataxia, dysmetria or tremor.  Gait and station: Patient walks without assistive device and is able and assisted stool climb up to the exam table.  Strength within normal limits. Stance is stable and normal. Tandem gait is unfragmented.  Deep tendon reflexes: in the  upper and lower extremities are symmetric , brisk and intact. Babinski maneuver response is  downgoing.   Assessment:  After physical and neurologic examination, review of laboratory studies, imaging, neurophysiology testing and pre-existing records, assessment is  1) insomnia with snoring and early morning awakenings. He reports being worried , has trouble to not think about his job related issues.   Plan:  Treatment plan and additional workup : 1) melatonin 10 mg one hour prior to intended bedtime.  If no sucess , Belsomra.   2) take 1 month break off Simvastatin for myalgia.  3) Sleep study , if possible HST - to set up at [facility]. If HST not available , split at 20 and score 4% [employer].

## 2013-04-24 ENCOUNTER — Telehealth: Payer: Self-pay | Admitting: *Deleted

## 2013-04-24 NOTE — Telephone Encounter (Signed)
Message copied by Daryll DrownHEUGLY, Dartanion Teo B on Tue Apr 24, 2013  6:22 PM ------      Message from: Waldron LabsLILLY, KISSA K      Created: Fri Apr 20, 2013  3:19 PM      Regarding: FW: please call to schedule       Arica [place], please contact pt for hst setup.  100% coverage - copay does not apply      ----- Message -----         From: Catha BrowNina Rose Ann S Dimaguila         Sent: 04/17/2013   4:38 PM           To: Berniece SalinesKissa K Lilly      Subject: please call to schedule                                  Please call to schedule patient for sleep study        ------

## 2013-04-24 NOTE — Telephone Encounter (Signed)
Called to schedule HST and patient stated he would have to call me back at a later date to let me know what he could do as far as scheduling.

## 2013-10-27 ENCOUNTER — Other Ambulatory Visit: Payer: Self-pay | Admitting: Neurology

## 2013-11-06 ENCOUNTER — Telehealth: Payer: Self-pay

## 2013-11-06 DIAGNOSIS — F5101 Primary insomnia: Secondary | ICD-10-CM

## 2013-11-06 NOTE — Telephone Encounter (Signed)
[facility] notified [facility] they are denying coverage on Belsomra.  They indicate the patient must have a documented trial and failure of Zolpidem, Zaleplon AND Eszopiclone for a minimum of two weeks each before they will consider coverage.  Would you like to change to a formulary alternative?  Please advise.  Thank you.

## 2013-11-08 NOTE — Telephone Encounter (Signed)
The only three drugs they allow at this time are Zolpidem, Zaleplon and Eszopiclone.  If the patient has documented trial and failure of all three drugs, they will reconsider coverage of Belsomra.   Thank you.

## 2013-11-08 NOTE — Telephone Encounter (Signed)
What are the alternatives? The three drugs listed. ?

## 2013-11-12 MED ORDER — ZALEPLON 10 MG PO CAPS: 10.0000 mg | ORAL_CAPSULE | Freq: Every evening | ORAL | Status: DC | PRN

## 2013-11-12 NOTE — Telephone Encounter (Signed)
Will prescribe sonata for this patient for 14 days, if the medication fails, will need documentation which other drugs failed as well. To get BELSOMRA coverage, Please call him and explain.

## 2013-11-12 NOTE — Telephone Encounter (Signed)
I called the patient back.  Got no answer.  Left message. 

## 2015-05-14 ENCOUNTER — Other Ambulatory Visit: Payer: Self-pay | Admitting: Internal Medicine

## 2015-05-14 DIAGNOSIS — I6523 Occlusion and stenosis of bilateral carotid arteries: Secondary | ICD-10-CM

## 2015-05-20 ENCOUNTER — Ambulatory Visit: Payer: PRIVATE HEALTH INSURANCE

## 2017-06-16 ENCOUNTER — Other Ambulatory Visit: Payer: Self-pay | Admitting: Orthopedic Surgery

## 2017-06-16 DIAGNOSIS — M25561 Pain in right knee: Secondary | ICD-10-CM

## 2017-08-02 ENCOUNTER — Ambulatory Visit (HOSPITAL_COMMUNITY): Admission: RE | Admit: 2017-08-02 | Payer: BLUE CROSS/BLUE SHIELD | Source: Ambulatory Visit

## 2018-09-12 ENCOUNTER — Encounter (HOSPITAL_COMMUNITY): Admission: EM | Disposition: A | Discharge status: Home / Self Care | Payer: Self-pay | Source: Home / Self Care

## 2018-09-12 ENCOUNTER — Emergency Department (HOSPITAL_COMMUNITY): Payer: BC Managed Care – PPO | Admitting: Certified Registered"

## 2018-09-12 ENCOUNTER — Emergency Department (HOSPITAL_COMMUNITY): Payer: BC Managed Care – PPO

## 2018-09-12 ENCOUNTER — Other Ambulatory Visit: Payer: Self-pay

## 2018-09-12 ENCOUNTER — Inpatient Hospital Stay (HOSPITAL_COMMUNITY)
Admission: EM | Admit: 2018-09-12 | Discharge: 2018-09-17 | DRG: 339 | Disposition: A | Discharge status: Home / Self Care | Payer: BC Managed Care – PPO | Attending: Surgery | Admitting: Surgery

## 2018-09-12 ENCOUNTER — Encounter (HOSPITAL_COMMUNITY): Payer: Self-pay | Admitting: Emergency Medicine

## 2018-09-12 DIAGNOSIS — Z6833 Body mass index (BMI) 33.0-33.9, adult: Secondary | ICD-10-CM | POA: Diagnosis not present

## 2018-09-12 DIAGNOSIS — K219 Gastro-esophageal reflux disease without esophagitis: Secondary | ICD-10-CM | POA: Diagnosis present

## 2018-09-12 DIAGNOSIS — Z789 Other specified health status: Secondary | ICD-10-CM

## 2018-09-12 DIAGNOSIS — G4710 Hypersomnia, unspecified: Secondary | ICD-10-CM | POA: Diagnosis present

## 2018-09-12 DIAGNOSIS — Z20828 Contact with and (suspected) exposure to other viral communicable diseases: Secondary | ICD-10-CM | POA: Diagnosis present

## 2018-09-12 DIAGNOSIS — F5104 Psychophysiologic insomnia: Secondary | ICD-10-CM | POA: Diagnosis present

## 2018-09-12 DIAGNOSIS — F17210 Nicotine dependence, cigarettes, uncomplicated: Secondary | ICD-10-CM | POA: Diagnosis present

## 2018-09-12 DIAGNOSIS — K589 Irritable bowel syndrome without diarrhea: Secondary | ICD-10-CM | POA: Diagnosis present

## 2018-09-12 DIAGNOSIS — R109 Unspecified abdominal pain: Secondary | ICD-10-CM | POA: Diagnosis present

## 2018-09-12 DIAGNOSIS — E782 Mixed hyperlipidemia: Secondary | ICD-10-CM | POA: Diagnosis present

## 2018-09-12 DIAGNOSIS — E669 Obesity, unspecified: Secondary | ICD-10-CM | POA: Diagnosis present

## 2018-09-12 DIAGNOSIS — K567 Ileus, unspecified: Secondary | ICD-10-CM | POA: Diagnosis not present

## 2018-09-12 DIAGNOSIS — I10 Essential (primary) hypertension: Secondary | ICD-10-CM

## 2018-09-12 DIAGNOSIS — F17200 Nicotine dependence, unspecified, uncomplicated: Secondary | ICD-10-CM | POA: Diagnosis present

## 2018-09-12 DIAGNOSIS — K3532 Acute appendicitis with perforation and localized peritonitis, without abscess: Secondary | ICD-10-CM | POA: Diagnosis present

## 2018-09-12 HISTORY — PX: LAPAROSCOPIC APPENDECTOMY: SHX408

## 2018-09-12 LAB — COMPREHENSIVE METABOLIC PANEL
ALT: 57 U/L — ABNORMAL HIGH (ref 0–44)
AST: 26 U/L (ref 15–41)
Albumin: 4.1 g/dL (ref 3.5–5.0)
Alkaline Phosphatase: 64 U/L (ref 38–126)
Anion gap: 12 (ref 5–15)
BUN: 19 mg/dL (ref 6–20)
CO2: 23 mmol/L (ref 22–32)
Calcium: 9 mg/dL (ref 8.9–10.3)
Chloride: 103 mmol/L (ref 98–111)
Creatinine, Ser: 0.97 mg/dL (ref 0.61–1.24)
GFR calc Af Amer: >60 mL/min (ref >60)
GFR calc non Af Amer: >60 mL/min (ref >60)
Glucose, Bld: 146 mg/dL — ABNORMAL HIGH (ref 70–99)
Potassium: 3.7 mmol/L (ref 3.5–5.1)
Sodium: 138 mmol/L (ref 135–145)
Total Bilirubin: 1.2 mg/dL (ref 0.3–1.2)
Total Protein: 7.4 g/dL (ref 6.5–8.1)

## 2018-09-12 LAB — CBC WITH DIFFERENTIAL/PLATELET
Abs Immature Granulocytes: 0.05 10*3/uL (ref 0.00–0.07)
Basophils Absolute: 0.1 10*3/uL (ref 0.0–0.1)
Basophils Relative: 0 %
Eosinophils Absolute: 0 10*3/uL (ref 0.0–0.5)
Eosinophils Relative: 0 %
HCT: 45 % (ref 39.0–52.0)
Hemoglobin: 15.2 g/dL (ref 13.0–17.0)
Immature Granulocytes: 0 %
Lymphocytes Relative: 4 %
Lymphs Abs: 0.5 10*3/uL — ABNORMAL LOW (ref 0.7–4.0)
MCH: 30.5 pg (ref 26.0–34.0)
MCHC: 33.8 g/dL (ref 30.0–36.0)
MCV: 90.2 fL (ref 80.0–100.0)
Monocytes Absolute: 1.1 10*3/uL — ABNORMAL HIGH (ref 0.1–1.0)
Monocytes Relative: 8 %
Neutro Abs: 11.8 10*3/uL — ABNORMAL HIGH (ref 1.7–7.7)
Neutrophils Relative %: 88 %
Platelets: 184 10*3/uL (ref 150–400)
RBC: 4.99 MIL/uL (ref 4.22–5.81)
RDW: 12.5 % (ref 11.5–15.5)
WBC: 13.5 10*3/uL — ABNORMAL HIGH (ref 4.0–10.5)
nRBC: 0 % (ref 0.0–0.2)

## 2018-09-12 LAB — URINALYSIS, ROUTINE W REFLEX MICROSCOPIC
Bilirubin Urine: NEGATIVE
Glucose, UA: NEGATIVE mg/dL
Hgb urine dipstick: NEGATIVE
Ketones, ur: 20 mg/dL — AB
Leukocytes,Ua: NEGATIVE
Nitrite: NEGATIVE
Protein, ur: NEGATIVE mg/dL
Specific Gravity, Urine: 1.023 (ref 1.005–1.030)
pH: 5 (ref 5.0–8.0)

## 2018-09-12 LAB — SARS CORONAVIRUS 2 BY RT PCR (HOSPITAL ORDER, PERFORMED IN ~~LOC~~ HOSPITAL LAB): SARS Coronavirus 2: NEGATIVE

## 2018-09-12 LAB — LIPASE, BLOOD: Lipase: 20 U/L (ref 11–51)

## 2018-09-12 SURGERY — APPENDECTOMY, LAPAROSCOPIC
Anesthesia: General

## 2018-09-12 MED ORDER — ACETAMINOPHEN 325 MG PO TABS
650.0000 mg | ORAL_TABLET | Freq: Four times a day (QID) | ORAL | Status: DC | PRN
  Administered 2018-09-13 – 2018-09-15 (×5): 650 mg via ORAL
  Filled 2018-09-12 (×5): qty 2

## 2018-09-12 MED ORDER — FENTANYL CITRATE (PF) 250 MCG/5ML IJ SOLN
INTRAMUSCULAR | Status: AC
  Filled 2018-09-12: qty 5

## 2018-09-12 MED ORDER — MIDAZOLAM HCL 2 MG/2ML IJ SOLN
INTRAMUSCULAR | Status: AC
  Filled 2018-09-12: qty 2

## 2018-09-12 MED ORDER — PROPOFOL 10 MG/ML IV BOLUS
INTRAVENOUS | Status: DC | PRN
  Administered 2018-09-12: 200 mg via INTRAVENOUS

## 2018-09-12 MED ORDER — METRONIDAZOLE IN NACL 5-0.79 MG/ML-% IV SOLN
500.0000 mg | Freq: Once | INTRAVENOUS | Status: AC
  Administered 2018-09-12: 21:00:00 500 mg via INTRAVENOUS
  Filled 2018-09-12: qty 100

## 2018-09-12 MED ORDER — MORPHINE SULFATE (PF) 4 MG/ML IV SOLN
4.0000 mg | Freq: Once | INTRAVENOUS | Status: AC
  Administered 2018-09-12: 4 mg via INTRAVENOUS
  Filled 2018-09-12: qty 1

## 2018-09-12 MED ORDER — ONDANSETRON HCL 4 MG/2ML IJ SOLN
INTRAMUSCULAR | Status: DC | PRN
  Administered 2018-09-12: 4 mg via INTRAVENOUS

## 2018-09-12 MED ORDER — ONDANSETRON HCL 4 MG/2ML IJ SOLN
4.0000 mg | Freq: Once | INTRAMUSCULAR | Status: AC
  Administered 2018-09-12: 4 mg via INTRAVENOUS
  Filled 2018-09-12: qty 2

## 2018-09-12 MED ORDER — ENOXAPARIN SODIUM 40 MG/0.4ML ~~LOC~~ SOLN
40.0000 mg | SUBCUTANEOUS | Status: DC
  Administered 2018-09-13 – 2018-09-16 (×4): 40 mg via SUBCUTANEOUS
  Filled 2018-09-12 (×4): qty 0.4

## 2018-09-12 MED ORDER — GABAPENTIN 300 MG PO CAPS
300.0000 mg | ORAL_CAPSULE | Freq: Two times a day (BID) | ORAL | Status: DC
  Administered 2018-09-13 – 2018-09-17 (×10): 300 mg via ORAL
  Filled 2018-09-12 (×10): qty 1

## 2018-09-12 MED ORDER — POTASSIUM CHLORIDE IN NACL 20-0.9 MEQ/L-% IV SOLN
INTRAVENOUS | Status: DC
  Administered 2018-09-12 – 2018-09-14 (×4): via INTRAVENOUS
  Administered 2018-09-15: 50 mL/h via INTRAVENOUS
  Filled 2018-09-12 (×7): qty 1000

## 2018-09-12 MED ORDER — DIPHENHYDRAMINE HCL 50 MG/ML IJ SOLN: 25.0000 mg | Freq: Four times a day (QID) | INTRAMUSCULAR | Status: DC | PRN

## 2018-09-12 MED ORDER — PROMETHAZINE HCL 25 MG/ML IJ SOLN: 6.2500 mg | INTRAMUSCULAR | Status: DC | PRN

## 2018-09-12 MED ORDER — MORPHINE SULFATE (PF) 2 MG/ML IV SOLN
1.0000 mg | INTRAVENOUS | Status: DC | PRN
  Administered 2018-09-14: 2 mg via INTRAVENOUS
  Filled 2018-09-12: qty 1

## 2018-09-12 MED ORDER — PIPERACILLIN-TAZOBACTAM 3.375 G IVPB
3.3750 g | Freq: Three times a day (TID) | INTRAVENOUS | Status: DC
  Administered 2018-09-12 – 2018-09-14 (×5): 3.375 g via INTRAVENOUS
  Filled 2018-09-12 (×5): qty 50

## 2018-09-12 MED ORDER — FENTANYL CITRATE (PF) 100 MCG/2ML IJ SOLN
INTRAMUSCULAR | Status: DC | PRN
  Administered 2018-09-12 (×4): 50 ug via INTRAVENOUS

## 2018-09-12 MED ORDER — ACETAMINOPHEN 650 MG RE SUPP: 650.0000 mg | Freq: Four times a day (QID) | RECTAL | Status: DC | PRN

## 2018-09-12 MED ORDER — BUPIVACAINE HCL (PF) 0.5 % IJ SOLN
INTRAMUSCULAR | Status: AC
  Filled 2018-09-12: qty 30

## 2018-09-12 MED ORDER — SUGAMMADEX SODIUM 200 MG/2ML IV SOLN
INTRAVENOUS | Status: DC | PRN
  Administered 2018-09-12: 250 mg via INTRAVENOUS

## 2018-09-12 MED ORDER — HYDROMORPHONE HCL 1 MG/ML IJ SOLN
1.0000 mg | Freq: Once | INTRAMUSCULAR | Status: AC
  Administered 2018-09-12: 20:00:00 1 mg via INTRAVENOUS
  Filled 2018-09-12: qty 1

## 2018-09-12 MED ORDER — ESMOLOL HCL 100 MG/10ML IV SOLN
INTRAVENOUS | Status: AC
  Filled 2018-09-12: qty 10

## 2018-09-12 MED ORDER — SODIUM CHLORIDE 0.9 % IV SOLN
2.0000 g | Freq: Once | INTRAVENOUS | Status: AC
  Administered 2018-09-12: 20:00:00 2 g via INTRAVENOUS
  Filled 2018-09-12: qty 20

## 2018-09-12 MED ORDER — ONDANSETRON 4 MG PO TBDP: 4.0000 mg | ORAL_TABLET | Freq: Four times a day (QID) | ORAL | Status: DC | PRN

## 2018-09-12 MED ORDER — MIDAZOLAM HCL 2 MG/2ML IJ SOLN: 0.5000 mg | Freq: Once | INTRAMUSCULAR | Status: DC | PRN

## 2018-09-12 MED ORDER — OXYCODONE HCL 5 MG PO TABS
5.0000 mg | ORAL_TABLET | ORAL | Status: DC | PRN
  Administered 2018-09-13: 5 mg via ORAL
  Administered 2018-09-14 – 2018-09-15 (×5): 10 mg via ORAL
  Filled 2018-09-12: qty 1
  Filled 2018-09-12 (×6): qty 2

## 2018-09-12 MED ORDER — IOHEXOL 300 MG/ML  SOLN
100.0000 mL | Freq: Once | INTRAMUSCULAR | Status: AC | PRN
  Administered 2018-09-12: 100 mL via INTRAVENOUS

## 2018-09-12 MED ORDER — HYDROMORPHONE HCL 1 MG/ML IJ SOLN: 0.2500 mg | INTRAMUSCULAR | Status: DC | PRN

## 2018-09-12 MED ORDER — BUPIVACAINE HCL (PF) 0.5 % IJ SOLN
INTRAMUSCULAR | Status: DC | PRN
  Administered 2018-09-12: 30 mL

## 2018-09-12 MED ORDER — SODIUM CHLORIDE 0.9 % IV BOLUS
1000.0000 mL | Freq: Once | INTRAVENOUS | Status: AC
  Administered 2018-09-12: 20:00:00 1000 mL via INTRAVENOUS

## 2018-09-12 MED ORDER — MIDAZOLAM HCL 5 MG/5ML IJ SOLN
INTRAMUSCULAR | Status: DC | PRN
  Administered 2018-09-12: 2 mg via INTRAVENOUS

## 2018-09-12 MED ORDER — LIDOCAINE 2% (20 MG/ML) 5 ML SYRINGE
INTRAMUSCULAR | Status: DC | PRN
  Administered 2018-09-12: 30 mg via INTRAVENOUS

## 2018-09-12 MED ORDER — DEXAMETHASONE SODIUM PHOSPHATE 10 MG/ML IJ SOLN
INTRAMUSCULAR | Status: DC | PRN
  Administered 2018-09-12: 10 mg via INTRAVENOUS

## 2018-09-12 MED ORDER — ONDANSETRON HCL 4 MG/2ML IJ SOLN
4.0000 mg | Freq: Four times a day (QID) | INTRAMUSCULAR | Status: DC | PRN
  Administered 2018-09-14: 17:00:00 4 mg via INTRAVENOUS
  Filled 2018-09-12: qty 2

## 2018-09-12 MED ORDER — SUCCINYLCHOLINE CHLORIDE 200 MG/10ML IV SOSY
PREFILLED_SYRINGE | INTRAVENOUS | Status: DC | PRN
  Administered 2018-09-12: 160 mg via INTRAVENOUS

## 2018-09-12 MED ORDER — ROCURONIUM BROMIDE 10 MG/ML (PF) SYRINGE
PREFILLED_SYRINGE | INTRAVENOUS | Status: DC | PRN
  Administered 2018-09-12: 10 mg via INTRAVENOUS
  Administered 2018-09-12: 35 mg via INTRAVENOUS

## 2018-09-12 MED ORDER — PHENYLEPHRINE 40 MCG/ML (10ML) SYRINGE FOR IV PUSH (FOR BLOOD PRESSURE SUPPORT)
PREFILLED_SYRINGE | INTRAVENOUS | Status: DC | PRN
  Administered 2018-09-12: 80 ug via INTRAVENOUS
  Administered 2018-09-12: 120 ug via INTRAVENOUS

## 2018-09-12 MED ORDER — CELECOXIB 200 MG PO CAPS
200.0000 mg | ORAL_CAPSULE | Freq: Two times a day (BID) | ORAL | Status: DC
  Administered 2018-09-13 – 2018-09-17 (×10): 200 mg via ORAL
  Filled 2018-09-12 (×10): qty 1

## 2018-09-12 MED ORDER — MEPERIDINE HCL 50 MG/ML IJ SOLN: 6.2500 mg | INTRAMUSCULAR | Status: DC | PRN

## 2018-09-12 MED ORDER — PROPOFOL 10 MG/ML IV BOLUS
INTRAVENOUS | Status: AC
  Filled 2018-09-12: qty 40

## 2018-09-12 MED ORDER — LACTATED RINGERS IV SOLN
INTRAVENOUS | Status: AC | PRN
  Administered 2018-09-12: 1000 mL via INTRAVENOUS

## 2018-09-12 MED ORDER — SODIUM CHLORIDE 0.9 % IV BOLUS
1000.0000 mL | Freq: Once | INTRAVENOUS | Status: AC
  Administered 2018-09-12: 1000 mL via INTRAVENOUS

## 2018-09-12 MED ORDER — SODIUM CHLORIDE 0.9 % IV SOLN
INTRAVENOUS | Status: DC | PRN
  Administered 2018-09-12: 500 mL via INTRAVENOUS

## 2018-09-12 MED ORDER — DIPHENHYDRAMINE HCL 25 MG PO CAPS
25.0000 mg | ORAL_CAPSULE | Freq: Four times a day (QID) | ORAL | Status: DC | PRN
  Administered 2018-09-14 – 2018-09-15 (×2): 25 mg via ORAL
  Filled 2018-09-12 (×2): qty 1

## 2018-09-12 MED ORDER — LACTATED RINGERS IV SOLN
INTRAVENOUS | Status: DC | PRN
  Administered 2018-09-12 (×2): via INTRAVENOUS

## 2018-09-12 SURGICAL SUPPLY — 37 items
ADH SKN CLS APL DERMABOND .7 (GAUZE/BANDAGES/DRESSINGS) ×1
APL PRP STRL LF DISP 70% ISPRP (MISCELLANEOUS) ×1
APPLIER CLIP 5 13 M/L LIGAMAX5 (MISCELLANEOUS)
APR CLP MED LRG 5 ANG JAW (MISCELLANEOUS)
BAG SPEC RTRVL LRG 6X4 10 (ENDOMECHANICALS) ×1
CHLORAPREP W/TINT 26 (MISCELLANEOUS) ×3 IMPLANT
CLIP APPLIE 5 13 M/L LIGAMAX5 (MISCELLANEOUS) IMPLANT
COVER SURGICAL LIGHT HANDLE (MISCELLANEOUS) ×3 IMPLANT
COVER WAND RF STERILE (DRAPES) IMPLANT
CUTTER FLEX LINEAR 45M (STAPLE) IMPLANT
DECANTER SPIKE VIAL GLASS SM (MISCELLANEOUS) ×3 IMPLANT
DERMABOND ADVANCED (GAUZE/BANDAGES/DRESSINGS) ×2
DERMABOND ADVANCED .7 DNX12 (GAUZE/BANDAGES/DRESSINGS) ×1 IMPLANT
DRAPE LAPAROSCOPIC ABDOMINAL (DRAPES) ×3 IMPLANT
ELECT COAG MONOPOLAR (ELECTROSURGICAL) ×3
ELECT REM PT RETURN 15FT ADLT (MISCELLANEOUS) ×3 IMPLANT
ELECTRODE COAG MONOPOLAR (ELECTROSURGICAL) ×1 IMPLANT
GLOVE SURG SIGNA 7.5 PF LTX (GLOVE) ×6 IMPLANT
GOWN STRL REUS W/TWL XL LVL3 (GOWN DISPOSABLE) ×6 IMPLANT
KIT BASIN OR (CUSTOM PROCEDURE TRAY) ×3 IMPLANT
KIT TURNOVER KIT A (KITS) ×2 IMPLANT
POUCH SPECIMEN RETRIEVAL 10MM (ENDOMECHANICALS) ×3 IMPLANT
RELOAD 45 VASCULAR/THIN (ENDOMECHANICALS) IMPLANT
RELOAD STAPLE 45 2.5 WHT GRN (ENDOMECHANICALS) IMPLANT
RELOAD STAPLE 45 3.5 BLU ETS (ENDOMECHANICALS) IMPLANT
RELOAD STAPLE TA45 3.5 REG BLU (ENDOMECHANICALS) ×3 IMPLANT
SET IRRIG TUBING LAPAROSCOPIC (IRRIGATION / IRRIGATOR) ×3 IMPLANT
SET TUBE SMOKE EVAC HIGH FLOW (TUBING) ×3 IMPLANT
SHEARS HARMONIC ACE PLUS 36CM (ENDOMECHANICALS) ×2 IMPLANT
SLEEVE XCEL OPT CAN 5 100 (ENDOMECHANICALS) ×3 IMPLANT
SUT MNCRL AB 4-0 PS2 18 (SUTURE) ×3 IMPLANT
TOWEL OR 17X26 10 PK STRL BLUE (TOWEL DISPOSABLE) ×3 IMPLANT
TOWEL OR NON WOVEN STRL DISP B (DISPOSABLE) ×3 IMPLANT
TRAY FOLEY MTR SLVR 16FR STAT (SET/KITS/TRAYS/PACK) ×3 IMPLANT
TRAY LAPAROSCOPIC (CUSTOM PROCEDURE TRAY) ×3 IMPLANT
TROCAR BLADELESS OPT 5 100 (ENDOMECHANICALS) ×3 IMPLANT
TROCAR XCEL BLUNT TIP 100MML (ENDOMECHANICALS) ×3 IMPLANT

## 2018-09-12 NOTE — ED Notes (Signed)
OR transport at bedside.

## 2018-09-12 NOTE — H&P (Signed)
Jason ClampSamuel A Price is an 53 y.o. male.   Chief Complaint: right sided abdominal pain HPI: This is a 53 year old gentleman who presents with right-sided abdominal pain.  He reports that the pain woke him up this morning at 2-3 AM.  He describes the pain is sharp and severe as well as constant.  He has had no nausea or vomiting.  The pain is been in the right lower quadrant.  He has no fevers or chills and no nausea or vomiting.  He underwent a CT scan of the abdomen and pelvis showing findings consistent with acute appendicitis.  His COVID test is negative.  He is otherwise without complaints.  Past Medical History:  Diagnosis Date  . Chronic insomnia   . Gastritis   . GERD (gastroesophageal reflux disease)   . Heartburn   . Hiatal hernia   . HYPERLIPIDEMIA-MIXED   . HYPERSOMNIA, ASSOCIATED WITH SLEEP APNEA   . IBS (irritable bowel syndrome)     Past Surgical History:  Procedure Laterality Date  . Athroscopic knee surgery      Family History  Problem Relation Age of Onset  . CAD Father        MI at age 52  . Alcoholism Father   . Heart attack Father   . Diabetes Father   . Hypertension Mother   . Insomnia Mother   . Breast cancer Maternal Aunt    Social History:  reports that he has been smoking cigarettes. He has a 4.00 pack-year smoking history. He has quit using smokeless tobacco.  His smokeless tobacco use included snuff. He reports current alcohol use. He reports that he does not use drugs.  Allergies:  Allergies  Allergen Reactions  . Niaspan [Niacin Er]     Flushing  . Simvastatin     Muscle aches    (Not in a hospital admission)   Results for orders placed or performed during the hospital encounter of 09/12/18 (from the past 48 hour(s))  SARS Coronavirus 2 [facility](Hospital order, Performed in [facility] lab) Nasopharyngeal Nasopharyngeal Swab     Status: None   Collection Time: 09/12/18  2:34 PM   Specimen: Nasopharyngeal Swab  Result Value Ref Range   SARS  Coronavirus 2 NEGATIVE NEGATIVE    Comment: (NOTE) If result is NEGATIVE SARS-CoV-2 target nucleic acids are NOT DETECTED. The SARS-CoV-2 RNA is generally detectable in upper and lower  respiratory specimens during the acute phase of infection. The lowest  concentration of SARS-CoV-2 viral copies this assay can detect is 250  copies / mL. A negative result does not preclude SARS-CoV-2 infection  and should not be used as the sole basis for treatment or other  patient management decisions.  A negative result may occur with  improper specimen collection / handling, submission of specimen other  than nasopharyngeal swab, presence of viral mutation(s) within the  areas targeted by this assay, and inadequate number of viral copies  (<250 copies / mL). A negative result must be combined with clinical  observations, patient history, and epidemiological information. If result is POSITIVE SARS-CoV-2 target nucleic acids are DETECTED. The SARS-CoV-2 RNA is generally detectable in upper and lower  respiratory specimens dur ing the acute phase of infection.  Positive  results are indicative of active infection with SARS-CoV-2.  Clinical  correlation with patient history and other diagnostic information is  necessary to determine patient infection status.  Positive results do  not rule out bacterial infection or co-infection with other viruses. If  result is PRESUMPTIVE POSTIVE SARS-CoV-2 nucleic acids MAY BE PRESENT.   A presumptive positive result was obtained on the submitted specimen  and confirmed on repeat testing.  While 2019 novel coronavirus  (SARS-CoV-2) nucleic acids may be present in the submitted sample  additional confirmatory testing may be necessary for epidemiological  and / or clinical management purposes  to differentiate between  SARS-CoV-2 and other Sarbecovirus currently known to infect humans.  If clinically indicated additional testing with an alternate test  methodology  [phone]([id number]) is advised. The SARS-CoV-2 RNA is generally  detectable in upper and lower respiratory sp ecimens during the acute  phase of infection. The expected result is Negative. Fact Sheet for Patients:  BoilerBrush.com.cyhttps://www.fda.gov/media/136312/download Fact Sheet for Healthcare Providers: https://pope.com/https://www.fda.gov/media/136313/download This test is not yet approved or cleared by the [place] FDA and has been authorized for detection and/or diagnosis of SARS-CoV-2 by FDA under an Emergency Use Authorization (EUA).  This EUA will remain in effect (meaning this test can be used) for the duration of the COVID-19 declaration under Section 564(b)(1) of the Act, 21 U.S.C. section 360bbb-3(b)(1), unless the authorization is terminated or revoked sooner. Performed at [facility], [address] [place], [place] [address]   Comprehensive metabolic panel     Status: Abnormal   Collection Time: 09/12/18  2:52 PM  Result Value Ref Range   Sodium 138 135 - 145 mmol/L   Potassium 3.7 3.5 - 5.1 mmol/L   Chloride 103 98 - 111 mmol/L   CO2 23 22 - 32 mmol/L   Glucose, Bld 146 (H) 70 - 99 mg/dL   BUN 19 6 - 20 mg/dL   Creatinine, Ser 1.470.97 0.61 - 1.24 mg/dL   Calcium 9.0 8.9 - 82.910.3 mg/dL   Total Protein 7.4 6.5 - 8.1 g/dL   Albumin 4.1 3.5 - 5.0 g/dL   AST 26 15 - 41 U/L   ALT 57 (H) 0 - 44 U/L   Alkaline Phosphatase 64 38 - 126 U/L   Total Bilirubin 1.2 0.3 - 1.2 mg/dL   GFR calc non Af Amer >60 >60 mL/min   GFR calc Af Amer >60 >60 mL/min   Anion gap 12 5 - 15    Comment: Performed at [facility], [address] [place], [place] [address]  CBC with Differential     Status: Abnormal   Collection Time: 09/12/18  2:52 PM  Result Value Ref Range   WBC 13.5 (H) 4.0 - 10.5 K/uL   RBC 4.99 4.22 - 5.81 MIL/uL   Hemoglobin 15.2 13.0 - 17.0 g/dL   HCT 08.645.0 57.839.0 - 46.952.0 %   MCV 90.2 80.0 - 100.0 fL   MCH 30.5 26.0 - 34.0 pg   MCHC 33.8 30.0 - 36.0 g/dL   RDW 62.912.5  52.811.5 - 41.315.5 %   Platelets 184 150 - 400 K/uL   nRBC 0.0 0.0 - 0.2 %   Neutrophils Relative % 88 %   Neutro Abs 11.8 (H) 1.7 - 7.7 K/uL   Lymphocytes Relative 4 %   Lymphs Abs 0.5 (L) 0.7 - 4.0 K/uL   Monocytes Relative 8 %   Monocytes Absolute 1.1 (H) 0.1 - 1.0 K/uL   Eosinophils Relative 0 %   Eosinophils Absolute 0.0 0.0 - 0.5 K/uL   Basophils Relative 0 %   Basophils Absolute 0.1 0.0 - 0.1 K/uL   Immature Granulocytes 0 %   Abs Immature Granulocytes 0.05 0.00 - 0.07 K/uL  Comment: Performed at [facility], [address], [place], [place] [address]  Lipase, blood     Status: None   Collection Time: 09/12/18  2:52 PM  Result Value Ref Range   Lipase 20 11 - 51 U/L    Comment: Performed at [facility], [address] [place], [place] [address]  Urinalysis, Routine w reflex microscopic     Status: Abnormal   Collection Time: 09/12/18  7:40 PM  Result Value Ref Range   Color, Urine YELLOW YELLOW   APPearance CLEAR CLEAR   Specific Gravity, Urine 1.023 1.005 - 1.030   pH 5.0 5.0 - 8.0   Glucose, UA NEGATIVE NEGATIVE mg/dL   Hgb urine dipstick NEGATIVE NEGATIVE   Bilirubin Urine NEGATIVE NEGATIVE   Ketones, ur 20 (A) NEGATIVE mg/dL   Protein, ur NEGATIVE NEGATIVE mg/dL   Nitrite NEGATIVE NEGATIVE   Leukocytes,Ua NEGATIVE NEGATIVE    Comment: Performed at [facility], [address], [place], [place] [address]   Ct Abdomen Pelvis W Contrast  Result Date: 09/12/2018 CLINICAL DATA:  Abdominal pain right lower quadrant pain EXAM: CT ABDOMEN AND PELVIS WITH CONTRAST TECHNIQUE: Multidetector CT imaging of the abdomen and pelvis was performed using the standard protocol following bolus administration of intravenous contrast. CONTRAST:  OMNIPAQUE IOHEXOL 300 MG/ML  SOLN COMPARISON:  CT 03/27/2012 FINDINGS: Lower chest: Lung bases demonstrate no acute consolidation or effusion. Heart size within normal limits.  Hepatobiliary: No focal liver abnormality is seen. No gallstones, gallbladder wall thickening, or biliary dilatation. Pancreas: Unremarkable. No pancreatic ductal dilatation or surrounding inflammatory changes. Spleen: Normal in size without focal abnormality. Adrenals/Urinary Tract: Adrenal glands are normal. No hydronephrosis. Cyst in the lower pole of the left kidney. The bladder is normal Stomach/Bowel: The stomach is nonenlarged. No dilated small bowel. Dilated appendix measuring up to 14 mm in size. Appendix is retrocecal with ascending configuration, tip in the right mid abdomen. No calcified stone. No extraluminal gas. Moderate periappendiceal inflammation. Vascular/Lymphatic: No significant vascular findings are present. No enlarged abdominal or pelvic lymph nodes. Reproductive: Prostate is unremarkable. 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 No significant listhesis. IMPRESSION: 1. Findings consistent with acute appendicitis. Appendix: Location: Right lower quadrant. Retrocecal appendix with ascending configuration and tip in the right mid abdomen. Diameter: 14 mm Appendicolith: None Mucosal hyper-enhancement: Mild mucosal hyperenhancement is present. Extraluminal gas: Negative Periappendiceal collection: Negative Electronically Signed   By: Jasmine Pang M.D.   On: 09/12/2018 18:21    Review of Systems  Constitutional: Negative for chills and fever.  Respiratory: Negative for cough and shortness of breath.   Cardiovascular: Negative for chest pain.  Gastrointestinal: Positive for abdominal pain. Negative for diarrhea, nausea and vomiting.  Genitourinary: Negative for dysuria.  All other systems reviewed and are negative.   Blood pressure 123/82, pulse (!) 111, temperature 98.3 F (36.8 C), temperature source Oral, resp. rate 16, height 5\' 8"  (1.727 m), weight 99.8 kg, SpO2 95 %. Physical Exam  Constitutional: He is oriented  to person, place, and time. He appears well-developed and well-nourished. He appears distressed.  HENT:  Head: Normocephalic and atraumatic.  Right Ear: External ear normal.  Left Ear: External ear normal.  Nose: Nose normal.  Mouth/Throat: Oropharynx is clear and moist. No oropharyngeal exudate.  Eyes: Pupils are equal, round, and reactive to light. Conjunctivae are normal. Right eye exhibits no discharge. Left eye exhibits no discharge. No  scleral icterus.  Neck: Normal range of motion. Neck supple. No tracheal deviation present.  Cardiovascular: Normal rate, regular rhythm, normal heart sounds and intact distal pulses.  No murmur heard. Respiratory: Effort normal and breath sounds normal. No respiratory distress.  GI: Soft. He exhibits no distension. There is abdominal tenderness. There is guarding.  There is tenderness with guarding in the right lower quadrant  Musculoskeletal: Normal range of motion.        General: No deformity or edema.  Neurological: He is alert and oriented to person, place, and time.  Skin: Skin is warm and dry. He is not diaphoretic.  Psychiatric: His behavior is normal. Judgment normal.     Assessment/Plan Acute appendicitis  I discussed the diagnosis with the patient and his family.  Removal of the appendix is recommended.  I discussed the laparoscopic approach.  I discussed the risk of surgery.  These risks include but are not limited to bleeding, infection, injury to surrounding structures, appendiceal stump leak, the need to convert to an open procedure, the need for further procedures, cardiopulmonary issues, DVT, postoperative recovery, etc.  IV antibiotics have been given.  Surgery is scheduled urgently  Coralie Keens, MD 09/12/2018, 7:55 PM

## 2018-09-12 NOTE — ED Provider Notes (Signed)
  Provider Note MRN:  [id number]  Arrival date & time: 09/12/18    ED Course and Medical Decision Making  Assumed care from Dr. Francia Greaves at shift change.  CT to evaluate for appendicitis.  CT confirms appendicitis.  Provided with IV fluids, IV antibiotics, discussed case with general surgery who will admit and likely take to the OR.  Critical Care Documentation Critical care time provided by me (excluding procedures): 33 minutes  Condition necessitating critical care: Acute appendicitis  Components of critical care management: reviewing of prior records, laboratory and imaging interpretation, frequent re-examination and reassessment of vital signs, administration of IV fluids, IV antibiotics, discussion with consulting services    Final Clinical Impressions(s) / ED Diagnoses     ICD-10-CM   1. Abdominal pain, unspecified abdominal location  R10.9     ED Discharge Orders    None      Discharge Instructions   None     Barth Kirks. Sedonia Small, [place] [email or web address]    Maudie Flakes, MD 09/12/18 [id number]

## 2018-09-12 NOTE — ED Notes (Signed)
Pt given CHG bath   

## 2018-09-12 NOTE — Anesthesia Postprocedure Evaluation (Signed)
Anesthesia Post Note  Patient: Jason Price  Procedure(s) Performed: APPENDECTOMY LAPAROSCOPIC (N/A )     Patient location during evaluation: PACU Anesthesia Type: General Level of consciousness: awake and alert, oriented and patient cooperative Pain management: pain level controlled Vital Signs Assessment: post-procedure vital signs reviewed and stable Respiratory status: spontaneous breathing, nonlabored ventilation and respiratory function stable Cardiovascular status: blood pressure returned to baseline and stable Postop Assessment: no apparent nausea or vomiting and adequate PO intake Anesthetic complications: no    Last Vitals:  Vitals:   09/12/18 2204 09/12/18 2215  BP: (!) 130/118 119/73  Pulse: (!) 116 (!) 105  Resp: (!) 24 20  Temp: 37.3 C   SpO2: 96% 94%    Last Pain:  Vitals:   09/12/18 2215  TempSrc:   PainSc: 3                  Foy Mungia,E. Ly Bacchi

## 2018-09-12 NOTE — Transfer of Care (Signed)
Immediate Anesthesia Transfer of Care Note  Patient: Jason Price  Procedure(s) Performed: APPENDECTOMY LAPAROSCOPIC (N/A )  Patient Location: PACU  Anesthesia Type:General  Level of Consciousness: awake and responds to stimulation  Airway & Oxygen Therapy: Patient Spontanous Breathing and Patient connected to face mask oxygen  Post-op Assessment: Report given to RN and Post -op Vital signs reviewed and stable  Post vital signs: Reviewed and stable  Last Vitals:  Vitals Value Taken Time  BP 130/118 09/12/18 2204  Temp 37.3 C 09/12/18 2204  Pulse 112 09/12/18 2212  Resp 22 09/12/18 2212  SpO2 98 % 09/12/18 2212  Vitals shown include unvalidated device data.  Last Pain:  Vitals:   09/12/18 2000  TempSrc: Oral  PainSc:          Complications: No apparent anesthesia complications

## 2018-09-12 NOTE — ED Provider Notes (Signed)
[facility]-EMERGENCY DEPT Provider Note   CSN: [id number] Arrival date & time: 09/12/18  1350     History   Chief Complaint No chief complaint on file.   HPI Jason Price is a 53 y.o. male.     53 year old male with prior medical history as detailed below presents for evaluation of abdominal pain.  Patient reports onset of right-sided abdominal pain at approximately 3 AM.  He reports persistent constant pain since.  He denies fever.  He denies change in his bowel movements.  He denies change in his urination.  He denies prior abdominal surgical history.  He was given fentanyl by EMS while enroute to the ED.  This has improved his pain somewhat.  The history is provided by the patient and medical records.  Abdominal Pain Pain location:  RLQ Pain quality: aching and cramping   Pain radiates to:  Does not radiate Pain severity:  Moderate Onset quality:  Gradual Duration:  12 hours Timing:  Constant Progression:  Worsening Chronicity:  New Relieved by:  Nothing Worsened by:  Nothing   Past Medical History:  Diagnosis Date  . Chronic insomnia   . Gastritis   . GERD (gastroesophageal reflux disease)   . Heartburn   . Hiatal hernia   . HYPERLIPIDEMIA-MIXED   . HYPERSOMNIA, ASSOCIATED WITH SLEEP APNEA   . IBS (irritable bowel syndrome)     Patient Active Problem List   Diagnosis Date Noted  . Chronic insomnia   . Chest pain 02/01/2013  . HYPERSOMNIA, ASSOCIATED WITH SLEEP APNEA 05/15/2009  . HYPERLIPIDEMIA-MIXED 04/08/2009  . TOBACCO USER 04/08/2009  . Other malaise and fatigue 04/08/2009    Past Surgical History:  Procedure Laterality Date  . Athroscopic knee surgery          Home Medications    Prior to Admission medications   Medication Sig Start Date End Date Taking? Authorizing Provider  aspirin 81 MG tablet Take 81 mg by mouth daily.    [provider]  BELSOMRA 10 MG TABS TAKE 1 TABLET BY MOUTH EVERY NIGHT AT BEDTIME  10/29/13   Dohmeier, Porfirio Mylar, MD  Omega-3 Fatty Acids (FISH OIL PO) Take 1 tablet by mouth 2 (two) times daily.     [provider]  omeprazole (PRILOSEC) 40 MG capsule Take 2 capsules by mouth daily. 01/19/13   [provider]  simvastatin (ZOCOR) 20 MG tablet Take 20 mg by mouth daily.    [provider]  zaleplon (SONATA) 10 MG capsule Take 1 capsule (10 mg total) by mouth at bedtime as needed for sleep. 11/12/13   Dohmeier, Porfirio Mylar, MD    Family History Family History  Problem Relation Age of Onset  . CAD Father        MI at age 21  . Alcoholism Father   . Heart attack Father   . Diabetes Father   . Hypertension Mother   . Insomnia Mother   . Breast cancer Maternal Aunt     Social History Social History   Tobacco Use  . Smoking status: Current Every Day Smoker    Packs/day: 0.50    Years: 8.00    Pack years: 4.00    Types: Cigarettes  . Smokeless tobacco: Former Neurosurgeon    Types: Snuff  Substance Use Topics  . Alcohol use: Yes    Comment: 18 a beers- weekly  . Drug use: No     Allergies   Niaspan [niacin er] and Simvastatin  Review of Systems Review of Systems  Gastrointestinal: Positive for abdominal pain.  All other systems reviewed and are negative.    Physical Exam Updated Vital Signs BP (!) 153/104   Pulse (!) 108   Temp 98.3 F (36.8 C) (Oral)   Resp 16   Ht 5\' 8"  (1.727 m)   Wt 99.8 kg   SpO2 95%   BMI 33.45 kg/m   Physical Exam Vitals signs and nursing note reviewed.  Constitutional:      General: He is not in acute distress.    Appearance: Normal appearance. He is well-developed.  HENT:     Head: Normocephalic and atraumatic.  Eyes:     Conjunctiva/sclera: Conjunctivae normal.     Pupils: Pupils are equal, round, and reactive to light.  Neck:     Musculoskeletal: Normal range of motion and neck supple.  Cardiovascular:     Rate and Rhythm: Normal rate and regular rhythm.     Heart sounds: Normal heart sounds.   Pulmonary:     Effort: Pulmonary effort is normal. No respiratory distress.     Breath sounds: Normal breath sounds.  Abdominal:     General: There is no distension.     Palpations: Abdomen is soft.     Tenderness: There is abdominal tenderness.     Comments: RLQ tenderness   Musculoskeletal: Normal range of motion.        General: No deformity.  Skin:    General: Skin is warm and dry.  Neurological:     Mental Status: He is alert and oriented to person, place, and time.      ED Treatments / Results  Labs (all labs ordered are listed, but only abnormal results are displayed) Labs Reviewed  COMPREHENSIVE METABOLIC PANEL - Abnormal; Notable for the following components:      Result Value   Glucose, Bld 146 (*)    ALT 57 (*)    All other components within normal limits  CBC WITH DIFFERENTIAL/PLATELET - Abnormal; Notable for the following components:   WBC 13.5 (*)    Neutro Abs 11.8 (*)    Lymphs Abs 0.5 (*)    Monocytes Absolute 1.1 (*)    All other components within normal limits  SARS CORONAVIRUS 2 (HOSPITAL ORDER, PERFORMED IN [facility])  LIPASE, BLOOD  URINALYSIS, ROUTINE W REFLEX MICROSCOPIC    EKG None  Radiology No results found.  Procedures Procedures (including critical care time)  Medications Ordered in ED Medications  sodium chloride 0.9 % bolus 1,000 mL (1,000 mLs Intravenous New Bag/Given 09/12/18 1447)  morphine 4 MG/ML injection 4 mg (4 mg Intravenous Given 09/12/18 1447)  ondansetron (ZOFRAN) injection 4 mg (4 mg Intravenous Given 09/12/18 1447)     Initial Impression / Assessment and Plan / ED Course  I have reviewed the triage vital signs and the nursing notes.  Pertinent labs & imaging results that were available during my care of the patient were reviewed by me and considered in my medical decision making (see chart for details).        MDM  Screen complete  Jason Price was evaluated in Emergency Department on  09/12/2018 for the symptoms described in the history of present illness. He was evaluated in the context of the global COVID-19 pandemic, which necessitated consideration that the patient might be at risk for infection with the SARS-CoV-2 virus that causes COVID-19. Institutional protocols and algorithms that pertain to the evaluation of patients at risk  for COVID-19 are in a state of rapid change based on information released by regulatory bodies including the CDC and federal and state organizations. These policies and algorithms were followed during the patient's care in the ED.  Patient is presenting for evaluation of abdominal pain.  Screening labs obtained.   CT pending.   Dr. Sedonia Small aware of pending studies and disposition.    Final Clinical Impressions(s) / ED Diagnoses   Final diagnoses:  Abdominal pain, unspecified abdominal location    ED Discharge Orders    None       Valarie Merino, MD 09/12/18 1640

## 2018-09-12 NOTE — Plan of Care (Signed)
Plan of care discussed.   

## 2018-09-12 NOTE — Op Note (Signed)
APPENDECTOMY LAPAROSCOPIC  Procedure Note  Jason ClampSamuel A Price 09/12/2018   Pre-op Diagnosis: appendicitis     Post-op Diagnosis: perforated appendicitis  Procedure(s): APPENDECTOMY LAPAROSCOPIC  Surgeon(s): Abigail MiyamotoBlackman, Athenia Rys, MD  Anesthesia: General  Staff:  Circulator: Alesia RichardsKontoulas, Elizabeth H, RN Scrub Person: Candelaria StagersKesler, Cynthia D  Estimated Blood Loss: Minimal               Specimens: sent to path  Indications: This is a 53 year old gentleman who presented with severe right lower quadrant abdominal pain.  He was found on a CT scan to have a dilated retrocecal appendix consistent with acute appendicitis.  The decision was made to proceed to the operating room.  Findings: The patient had a fairly intense inflammatory process in the right abdomen.  He had a foreshortened colon.  There was a large amount of fibrinous exudate and at least 8 inches of small bowel that was inflamed.  There is purulence behind the colon.  I removed what appeared to be a perforated appendix.  Procedure: The patient was brought to the operating room and identified as the correct patient.  He was placed upon on the operating room table and general anesthesia was induced.  His abdomen was then prepped and draped in the usual sterile fashion.  I made a vertical incision just above the umbilicus.  I took this down to the fascia which was then opened the scalpel.  I then used a hemostat to gain entrance into the peritoneal cavity under direct vision.  A 0 Vicryl pursestring suture was placed around the fascial opening.  The [facility] was placed to the opening and insufflation of the abdomen was begun.  I then placed a 5 mm trocar in the patient's right upper quadrant and another in the left lower quadrant under direct vision.  There was a significant inflammatory process in the right abdomen with fibrinous exudate on the small bowel and on the abdominal wall.  I could visualize the fairly short right colon going toward the  hepatic flexure.  I had to peel off the omentum stuck on the colon.  There was at least 8 inches of small bowel stuck in this area to which I was able to peel off.  It was fairly injected and erythematous and fibrinous exudate was on this area small bowel.  I could then visualize what appeared to be a perforated appendix with inflamed mesoappendix and a large lymph node sitting right on top of what appeared to be the terminal ileum.  The ileum was then fixated toward the retroperitoneum and I cannot elevated any further.  I took down what appeared to be the mesoappendix with the harmonic scalpel.  I then transected what appeared to be base of the appendix right beside the terminal ileum with a laparoscopic stapler.  I then placed this in an Endosac and removed it with what appeared to be a large lymph node or fat through the umbilicus.  The cecum itself was soft but was fixated the retroperitoneum and I cannot rotated any further to see the backside of the colon.  I could not identify anything else that appeared to be an appendiceal base.  At this point I copiously irrigated the right upper quadrant and right abdomen with normal saline.  Hemostasis appeared to be achieved.  At this point, I decided to forego any formal exploratory laparotomy until the final pathology was confirmed on what I removed.  The abdomen was deflated after hemostasis appeared to be achieved.  All  ports were removed.  I closed the 0 Vicryl suture at the umbilicus closing the fascia.  All incisions were then injected with Marcaine and closed with 4-0 Monocryl sutures.  Dermabond was then applied.  The patient was stable throughout the procedure.  All counts were correct at the end of the procedure.  The patient was then extubated in the operating room and taken in a stable condition to the recovery room.          Coralie Keens   Date: 09/12/2018  Time: 9:58 PM

## 2018-09-12 NOTE — Anesthesia Preprocedure Evaluation (Addendum)
Anesthesia Evaluation  Patient identified by MRN, date of birth, ID band Patient awake    Reviewed: Allergy & Precautions, NPO status , Patient's Chart, lab work & pertinent test results  History of Anesthesia Complications Negative for: history of anesthetic complications  Airway Mallampati: IV  TM Distance: >3 FB Neck ROM: Full    Dental  (+) Dental Advisory Given   Pulmonary Current Smoker and Patient abstained from smoking.,  09/12/2018 SARS coronavirus NEG   breath sounds clear to auscultation       Cardiovascular (-) anginanegative cardio ROS   Rhythm:Regular Rate:Normal     Neuro/Psych negative neurological ROS     GI/Hepatic Neg liver ROS, GERD  Medicated and Controlled,Acute appy: n/v today   Endo/Other  obese  Renal/GU negative Renal ROS     Musculoskeletal   Abdominal (+) + obese,   Peds  Hematology negative hematology ROS (+)   Anesthesia Other Findings   Reproductive/Obstetrics                           Anesthesia Physical Anesthesia Plan  ASA: II and emergent  Anesthesia Plan: General   Post-op Pain Management:    Induction: Intravenous and Rapid sequence  PONV Risk Score and Plan: 1 and Ondansetron and Dexamethasone  Airway Management Planned: Oral ETT and Video Laryngoscope Planned  Additional Equipment:   Intra-op Plan:   Post-operative Plan: Extubation in OR  Informed Consent: I have reviewed the patients History and Physical, chart, labs and discussed the procedure including the risks, benefits and alternatives for the proposed anesthesia with the patient or authorized representative who has indicated his/her understanding and acceptance.     Dental advisory given  Plan Discussed with: CRNA and Surgeon  Anesthesia Plan Comments:        Anesthesia Quick Evaluation

## 2018-09-12 NOTE — ED Notes (Signed)
OR front desk called, said Dr. Ninfa Linden on the way to see pt, pt going to surgery soon. Pt needs CHG bath and consent.

## 2018-09-12 NOTE — Anesthesia Procedure Notes (Signed)
Procedure Name: Intubation Date/Time: 09/12/2018 8:36 PM Performed by: Silas Sacramento, CRNA Pre-anesthesia Checklist: Patient identified, Emergency Drugs available, Suction available and Patient being monitored Patient Re-evaluated:Patient Re-evaluated prior to induction Oxygen Delivery Method: Circle system utilized Preoxygenation: Pre-oxygenation with 100% oxygen Induction Type: IV induction, Rapid sequence and Cricoid Pressure applied Laryngoscope Size: Glidescope and 4 Grade View: Grade I Tube type: Oral Number of attempts: 1 Airway Equipment and Method: Video-laryngoscopy and Rigid stylet Placement Confirmation: ETT inserted through vocal cords under direct vision,  positive ETCO2 and breath sounds checked- equal and bilateral Secured at: 24 cm Tube secured with: Tape Dental Injury: Teeth and Oropharynx as per pre-operative assessment

## 2018-09-12 NOTE — ED Triage Notes (Signed)
EMS states pt has been nauseous since 0300. RLQ pain began at 1200. EMS gave pt 100 of Fentanyl.   BP 182/106 HR 88 RR 18  SpO2 98 CGB 154 Temp  96.5

## 2018-09-13 ENCOUNTER — Encounter (HOSPITAL_COMMUNITY): Payer: Self-pay | Admitting: Surgery

## 2018-09-13 LAB — CBC
HCT: 42.7 % (ref 39.0–52.0)
Hemoglobin: 13.8 g/dL (ref 13.0–17.0)
MCH: 30.1 pg (ref 26.0–34.0)
MCHC: 32.3 g/dL (ref 30.0–36.0)
MCV: 93 fL (ref 80.0–100.0)
Platelets: 152 10*3/uL (ref 150–400)
RBC: 4.59 MIL/uL (ref 4.22–5.81)
RDW: 12.9 % (ref 11.5–15.5)
WBC: 15.5 10*3/uL — ABNORMAL HIGH (ref 4.0–10.5)
nRBC: 0 % (ref 0.0–0.2)

## 2018-09-13 LAB — BASIC METABOLIC PANEL
Anion gap: 10 (ref 5–15)
BUN: 14 mg/dL (ref 6–20)
CO2: 20 mmol/L — ABNORMAL LOW (ref 22–32)
Calcium: 8.7 mg/dL — ABNORMAL LOW (ref 8.9–10.3)
Chloride: 107 mmol/L (ref 98–111)
Creatinine, Ser: 1.09 mg/dL (ref 0.61–1.24)
GFR calc Af Amer: >60 mL/min (ref >60)
GFR calc non Af Amer: >60 mL/min (ref >60)
Glucose, Bld: 222 mg/dL — ABNORMAL HIGH (ref 70–99)
Potassium: 4.3 mmol/L (ref 3.5–5.1)
Sodium: 137 mmol/L (ref 135–145)

## 2018-09-13 MED ORDER — SACCHAROMYCES BOULARDII 250 MG PO CAPS
250.0000 mg | ORAL_CAPSULE | Freq: Two times a day (BID) | ORAL | Status: DC
  Administered 2018-09-13 – 2018-09-17 (×9): 250 mg via ORAL
  Filled 2018-09-13 (×10): qty 1

## 2018-09-13 MED ORDER — GUAIFENESIN-DM 100-10 MG/5ML PO SYRP: 5.0000 mL | ORAL_SOLUTION | ORAL | Status: DC | PRN

## 2018-09-13 MED ORDER — ALUM & MAG HYDROXIDE-SIMETH 200-200-20 MG/5ML PO SUSP
30.0000 mL | ORAL | Status: DC | PRN
  Administered 2018-09-13: 21:00:00 30 mL via ORAL

## 2018-09-13 NOTE — Progress Notes (Signed)
1 Day Post-Op  Subjective: CC: Doing well. Some soreness still in the RLQ and around laparoscopic incisions. Tolerating CLD without any N/V. Mobilized in the halls last night without difficulty. Voiding.   Objective: Vital signs in last 24 hours: Temp:  [97.8 F (36.6 C)-103.1 F (39.5 C)] 97.8 F (36.6 C) (09/02 0447) Pulse Rate:  [74-116] 74 (09/02 0447) Resp:  [16-24] 18 (09/02 0447) BP: (114-165)/(73-118) 117/81 (09/02 0447) SpO2:  [94 %-99 %] 96 % (09/02 0447) Weight:  [99.8 kg] 99.8 kg (09/01 1414) Last BM Date: 09/12/18  Intake/Output from previous day: 09/01 0701 - 09/02 0700 In: 3922.5 [P.O.:820; I.V.:2052.5; IV Piggyback:1050] Out: 750 [Urine:725; Blood:25] Intake/Output this shift: No intake/output data recorded.  PE: Gen:  Alert, NAD, pleasant Lungs: Normal rate and effort Abd: Soft, protuberant but ND, appropriately tender in the RLQ and around laparoscopic incisions. +BS. Incisions with glue intact appears well and are without drainage, bleeding, or signs of infection.  Psych: A&Ox3  Skin: no rashes noted, warm and dry   Lab Results:  Recent Labs    09/12/18 1452 09/13/18 0308  WBC 13.5* 15.5*  HGB 15.2 13.8  HCT 45.0 42.7  PLT 184 152   BMET Recent Labs    09/12/18 1452 09/13/18 0308  NA 138 137  K 3.7 4.3  CL 103 107  CO2 23 20*  GLUCOSE 146* 222*  BUN 19 14  CREATININE 0.97 1.09  CALCIUM 9.0 8.7*   PT/INR No results for input(s): LABPROT, INR in the last 72 hours. CMP     Component Value Date/Time   NA 137 09/13/2018 0308   K 4.3 09/13/2018 0308   CL 107 09/13/2018 0308   CO2 20 (L) 09/13/2018 0308   GLUCOSE 222 (H) 09/13/2018 0308   BUN 14 09/13/2018 0308   CREATININE 1.09 09/13/2018 0308   CALCIUM 8.7 (L) 09/13/2018 0308   PROT 7.4 09/12/2018 1452   ALBUMIN 4.1 09/12/2018 1452   AST 26 09/12/2018 1452   ALT 57 (H) 09/12/2018 1452   ALKPHOS 64 09/12/2018 1452   BILITOT 1.2 09/12/2018 1452   GFRNONAA >60 09/13/2018  0308   GFRAA >60 09/13/2018 0308   Lipase     Component Value Date/Time   LIPASE 20 09/12/2018 1452       Studies/Results: Ct Abdomen Pelvis W Contrast  Result Date: 09/12/2018 CLINICAL DATA:  Abdominal pain right lower quadrant pain EXAM: CT ABDOMEN AND PELVIS WITH CONTRAST TECHNIQUE: Multidetector CT imaging of the abdomen and pelvis was performed using the standard protocol following bolus administration of intravenous contrast. CONTRAST:  132mL OMNIPAQUE IOHEXOL 300 MG/ML  SOLN COMPARISON:  CT 03/27/2012 FINDINGS: Lower chest: Lung bases demonstrate no acute consolidation or effusion. Heart size within normal limits. Hepatobiliary: No focal liver abnormality is seen. No gallstones, gallbladder wall thickening, or biliary dilatation. Pancreas: Unremarkable. No pancreatic ductal dilatation or surrounding inflammatory changes. Spleen: Normal in size without focal abnormality. Adrenals/Urinary Tract: Adrenal glands are normal. No hydronephrosis. Cyst in the lower pole of the left kidney. The bladder is normal Stomach/Bowel: The stomach is nonenlarged. No dilated small bowel. Dilated appendix measuring up to 14 mm in size. Appendix is retrocecal with ascending configuration, tip in the right mid abdomen. No calcified stone. No extraluminal gas. Moderate periappendiceal inflammation. Vascular/Lymphatic: No significant vascular findings are present. No enlarged abdominal or pelvic lymph nodes. Reproductive: Prostate is unremarkable. 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 No significant  listhesis. IMPRESSION: 1. Findings consistent with acute appendicitis. Appendix: Location: Right lower quadrant. Retrocecal appendix with ascending configuration and tip in the right mid abdomen. Diameter: 14 mm Appendicolith: None Mucosal hyper-enhancement: Mild mucosal hyperenhancement is present. Extraluminal gas: Negative Periappendiceal collection:  Negative Electronically Signed   By: Jasmine PangKim  Fujinaga M.D.   On: 09/12/2018 18:21    Anti-infectives: Anti-infectives (From admission, onward)   Start     Dose/Rate Route Frequency Ordered Stop   09/12/18 2359  piperacillin-tazobactam (ZOSYN) IVPB 3.375 g     3.375 g 12.5 mL/hr over 240 Minutes Intravenous Every 8 hours 09/12/18 2303     09/12/18 1915  cefTRIAXone (ROCEPHIN) 2 g in sodium chloride 0.9 % 100 mL IVPB     2 g 200 mL/hr over 30 Minutes Intravenous  Once 09/12/18 1907 09/12/18 2005   09/12/18 1915  metroNIDAZOLE (FLAGYL) IVPB 500 mg     500 mg 100 mL/hr over 60 Minutes Intravenous  Once 09/12/18 1907 09/12/18 2042       Assessment/Plan GERD HLD  Perforated appendicitis S/p Laparoscopic Appendectomy - Dr. Magnus IvanBlackman - 09/12/2018 - POD #1 - Keep on IV abx. Will need total of 10 days of abx - Trend WBC and watch through the end of the week. - Mobilize and IS  FEN - Regular VTE - SCDs, Lovenox ID - Zosyn   LOS: 1 day    Jacinto HalimMichael M Treyana Sturgell , [facility] 09/13/2018, 8:52 AM Pager: [phone]

## 2018-09-14 LAB — CBC
HCT: 37.8 % — ABNORMAL LOW (ref 39.0–52.0)
Hemoglobin: 12.1 g/dL — ABNORMAL LOW (ref 13.0–17.0)
MCH: 30.4 pg (ref 26.0–34.0)
MCHC: 32 g/dL (ref 30.0–36.0)
MCV: 95 fL (ref 80.0–100.0)
Platelets: 138 10*3/uL — ABNORMAL LOW (ref 150–400)
RBC: 3.98 MIL/uL — ABNORMAL LOW (ref 4.22–5.81)
RDW: 13 % (ref 11.5–15.5)
WBC: 13.4 10*3/uL — ABNORMAL HIGH (ref 4.0–10.5)
nRBC: 0 % (ref 0.0–0.2)

## 2018-09-14 LAB — BASIC METABOLIC PANEL
Anion gap: 8 (ref 5–15)
BUN: 15 mg/dL (ref 6–20)
CO2: 22 mmol/L (ref 22–32)
Calcium: 8.1 mg/dL — ABNORMAL LOW (ref 8.9–10.3)
Chloride: 109 mmol/L (ref 98–111)
Creatinine, Ser: 0.89 mg/dL (ref 0.61–1.24)
GFR calc Af Amer: >60 mL/min (ref >60)
GFR calc non Af Amer: >60 mL/min (ref >60)
Glucose, Bld: 140 mg/dL — ABNORMAL HIGH (ref 70–99)
Potassium: 3.9 mmol/L (ref 3.5–5.1)
Sodium: 139 mmol/L (ref 135–145)

## 2018-09-14 MED ORDER — PANTOPRAZOLE SODIUM 40 MG PO TBEC
40.0000 mg | DELAYED_RELEASE_TABLET | Freq: Every day | ORAL | Status: DC
  Administered 2018-09-14 – 2018-09-17 (×4): 40 mg via ORAL
  Filled 2018-09-14 (×4): qty 1

## 2018-09-14 MED ORDER — AMOXICILLIN-POT CLAVULANATE 875-125 MG PO TABS: 1.0000 | ORAL_TABLET | Freq: Two times a day (BID) | ORAL | Status: DC

## 2018-09-14 MED ORDER — DOCUSATE SODIUM 100 MG PO CAPS
100.0000 mg | ORAL_CAPSULE | Freq: Two times a day (BID) | ORAL | Status: DC
  Administered 2018-09-14 – 2018-09-15 (×4): 100 mg via ORAL
  Filled 2018-09-14 (×5): qty 1

## 2018-09-14 MED ORDER — PIPERACILLIN-TAZOBACTAM 3.375 G IVPB
3.3750 g | Freq: Three times a day (TID) | INTRAVENOUS | Status: DC
  Administered 2018-09-14 – 2018-09-17 (×9): 3.375 g via INTRAVENOUS
  Filled 2018-09-14 (×11): qty 50

## 2018-09-14 MED ORDER — POLYETHYLENE GLYCOL 3350 17 G PO PACK
17.0000 g | PACK | Freq: Every day | ORAL | Status: DC | PRN
  Filled 2018-09-14: qty 1

## 2018-09-14 NOTE — Progress Notes (Addendum)
2 Days Post-Op  Subjective: CC: Abdominal bloating Patient reports that yesterday he felt bloated.  Reports this improved overnight after he passed some flatus.  He reports he is tolerating his diet without any nausea or vomiting.  He does note some burping and belching.  He reports his pain has decreased but he does still have some soreness in his right lower quadrant around his laparoscopic incisions.  He has been mobilizing in the hall.  No BM since 8/31.  Objective: Vital signs in last 24 hours: Temp:  [97.6 F (36.4 C)-98.2 F (36.8 C)] 98.1 F (36.7 C) (09/03 0500) Pulse Rate:  [79-98] 79 (09/03 0500) Resp:  [16-20] 16 (09/03 0500) BP: (120-141)/(85-95) 141/94 (09/03 0500) SpO2:  [95 %-97 %] 96 % (09/03 0500) Last BM Date: 09/11/18  Intake/Output from previous day: 09/02 0701 - 09/03 0700 In: 3551.2 [P.O.:1780; I.V.:1671.2; IV Piggyback:100] Out: 1450 [Urine:1450] Intake/Output this shift: No intake/output data recorded.  PE: Gen:  Alert, NAD, pleasant Lungs: Normal rate and effort Abd: Soft, protuberant with mild distension, appropriately tender in the RLQ and around laparoscopic incisions. +BS. Incisions with glue intact appears well and are without drainage, bleeding, or signs of infection.  Psych: A&Ox3  Skin: no rashes noted, warm and dry  Lab Results:  Recent Labs    09/13/18 0308 09/14/18 0320  WBC 15.5* 13.4*  HGB 13.8 12.1*  HCT 42.7 37.8*  PLT 152 138*   BMET Recent Labs    09/13/18 0308 09/14/18 0320  NA 137 139  K 4.3 3.9  CL 107 109  CO2 20* 22  GLUCOSE 222* 140*  BUN 14 15  CREATININE 1.09 0.89  CALCIUM 8.7* 8.1*   PT/INR No results for input(s): LABPROT, INR in the last 72 hours. CMP     Component Value Date/Time   NA 139 09/14/2018 0320   K 3.9 09/14/2018 0320   CL 109 09/14/2018 0320   CO2 22 09/14/2018 0320   GLUCOSE 140 (H) 09/14/2018 0320   BUN 15 09/14/2018 0320   CREATININE 0.89 09/14/2018 0320   CALCIUM 8.1 (L)  09/14/2018 0320   PROT 7.4 09/12/2018 1452   ALBUMIN 4.1 09/12/2018 1452   AST 26 09/12/2018 1452   ALT 57 (H) 09/12/2018 1452   ALKPHOS 64 09/12/2018 1452   BILITOT 1.2 09/12/2018 1452   GFRNONAA >60 09/14/2018 0320   GFRAA >60 09/14/2018 0320   Lipase     Component Value Date/Time   LIPASE 20 09/12/2018 1452       Studies/Results: Ct Abdomen Pelvis W Contrast  Result Date: 09/12/2018 CLINICAL DATA:  Abdominal pain right lower quadrant pain EXAM: CT ABDOMEN AND PELVIS WITH CONTRAST TECHNIQUE: Multidetector CT imaging of the abdomen and pelvis was performed using the standard protocol following bolus administration of intravenous contrast. CONTRAST:  167mL OMNIPAQUE IOHEXOL 300 MG/ML  SOLN COMPARISON:  CT 03/27/2012 FINDINGS: Lower chest: Lung bases demonstrate no acute consolidation or effusion. Heart size within normal limits. Hepatobiliary: No focal liver abnormality is seen. No gallstones, gallbladder wall thickening, or biliary dilatation. Pancreas: Unremarkable. No pancreatic ductal dilatation or surrounding inflammatory changes. Spleen: Normal in size without focal abnormality. Adrenals/Urinary Tract: Adrenal glands are normal. No hydronephrosis. Cyst in the lower pole of the left kidney. The bladder is normal Stomach/Bowel: The stomach is nonenlarged. No dilated small bowel. Dilated appendix measuring up to 14 mm in size. Appendix is retrocecal with ascending configuration, tip in the right mid abdomen. No calcified stone. No extraluminal gas.  Moderate periappendiceal inflammation. Vascular/Lymphatic: No significant vascular findings are present. No enlarged abdominal or pelvic lymph nodes. Reproductive: Prostate is unremarkable. 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 No significant listhesis. IMPRESSION: 1. Findings consistent with acute appendicitis. Appendix: Location: Right lower quadrant. Retrocecal appendix with  ascending configuration and tip in the right mid abdomen. Diameter: 14 mm Appendicolith: None Mucosal hyper-enhancement: Mild mucosal hyperenhancement is present. Extraluminal gas: Negative Periappendiceal collection: Negative Electronically Signed   By: Jasmine PangKim  Fujinaga M.D.   On: 09/12/2018 18:21    Anti-infectives: Anti-infectives (From admission, onward)   Start     Dose/Rate Route Frequency Ordered Stop   09/14/18 1000  amoxicillin-clavulanate (AUGMENTIN) 875-125 MG per tablet 1 tablet     1 tablet Oral Every 12 hours 09/14/18 0823 09/22/18 0959   09/12/18 2359  piperacillin-tazobactam (ZOSYN) IVPB 3.375 g  Status:  Discontinued     3.375 g 12.5 mL/hr over 240 Minutes Intravenous Every 8 hours 09/12/18 2303 09/14/18 0823   09/12/18 1915  cefTRIAXone (ROCEPHIN) 2 g in sodium chloride 0.9 % 100 mL IVPB     2 g 200 mL/hr over 30 Minutes Intravenous  Once 09/12/18 1907 09/12/18 2005   09/12/18 1915  metroNIDAZOLE (FLAGYL) IVPB 500 mg     500 mg 100 mL/hr over 60 Minutes Intravenous  Once 09/12/18 1907 09/12/18 2042       Assessment/Plan GERD HLD  Perforated appendicitis S/p Laparoscopic Appendectomy - Dr. Magnus IvanBlackman - 09/12/2018 - POD #1 - Transition to oral abx. Will need total of 10 days of abx - Trend WBC, currently downtrending. If continues to improve, likely d/c tomorrow. If worsens, may need a repeat CT during the weekend.  - Reports he is due for a colonoscopy this year. Recommended that he call his GI doctor to have this scheduled in 4-6 weeks.  - Add bowel regimen.  Add Maalox and Protonix for bloating/burping belching - Mobilize and IS  FEN - Regular VTE - SCDs, Lovenox ID - Zosyn 9/1-9/3. Augmentin >>   LOS: 2 days    Jacinto HalimMichael M Cyerra Yim , [facility] 09/14/2018, 8:22 AM Pager: [phone]

## 2018-09-14 NOTE — Progress Notes (Signed)
PHARMACY NOTE -  [facility] has been assisting with dosing of Zosyn for IAI.  Dosage remains stable at 3.375 g IV q8 hr and need for further dosage adjustment appears unlikely at present given stable renal function at baseline  Pharmacy will sign off, following peripherally for culture results or dose adjustments. Please reconsult if a change in clinical status warrants re-evaluation of dosage.  Reuel Boom, PharmD, BCPS [phone] 09/14/2018, 10:29 AM

## 2018-09-15 LAB — CBC
HCT: 40.8 % (ref 39.0–52.0)
Hemoglobin: 12.9 g/dL — ABNORMAL LOW (ref 13.0–17.0)
MCH: 30.5 pg (ref 26.0–34.0)
MCHC: 31.6 g/dL (ref 30.0–36.0)
MCV: 96.5 fL (ref 80.0–100.0)
Platelets: 164 10*3/uL (ref 150–400)
RBC: 4.23 MIL/uL (ref 4.22–5.81)
RDW: 13.2 % (ref 11.5–15.5)
WBC: 12.7 10*3/uL — ABNORMAL HIGH (ref 4.0–10.5)
nRBC: 0 % (ref 0.0–0.2)

## 2018-09-15 NOTE — Progress Notes (Signed)
3 Days Post-Op  Subjective: CC: Abdominal pain Patient reports yesterday afternoon he had worsening pain in his RLQ and bloating especially after getting up and walking around. He denies n/v. His pain improved after Oxy and is tolerable. He is unsure about pain this AM as he just woke up. Reports he does not have an appetite and is not eating much at all.   Objective: Vital signs in last 24 hours: Temp:  [98.4 F (36.9 C)-98.8 F (37.1 C)] 98.4 F (36.9 C) (09/04 0411) Pulse Rate:  [84-106] 84 (09/04 0411) Resp:  [14-17] 16 (09/04 0411) BP: (124-161)/(84-98) 124/84 (09/04 0411) SpO2:  [96 %-97 %] 96 % (09/04 0411) Last BM Date: 09/11/18  Intake/Output from previous day: 09/03 0701 - 09/04 0700 In: 1290.4 [I.V.:1240.4; IV Piggyback:50] Out: -  Intake/Output this shift: No intake/output data recorded.  PE: Gen: Alert, NAD, pleasant Lungs: Normal rate and effort Abd: Soft,protuberant with mild distension, mild tenderness in the RLQ and around laparoscopic incisions.+BS.Incisions with glue intact appears well and are without drainage, bleeding, or signs of infection. Psych: A&Ox3  Skin: no rashes noted, warm and dry  Lab Results:  Recent Labs    09/14/18 0320 09/15/18 0259  WBC 13.4* 12.7*  HGB 12.1* 12.9*  HCT 37.8* 40.8  PLT 138* 164   BMET Recent Labs    09/13/18 0308 09/14/18 0320  NA 137 139  K 4.3 3.9  CL 107 109  CO2 20* 22  GLUCOSE 222* 140*  BUN 14 15  CREATININE 1.09 0.89  CALCIUM 8.7* 8.1*   PT/INR No results for input(s): LABPROT, INR in the last 72 hours. CMP     Component Value Date/Time   NA 139 09/14/2018 0320   K 3.9 09/14/2018 0320   CL 109 09/14/2018 0320   CO2 22 09/14/2018 0320   GLUCOSE 140 (H) 09/14/2018 0320   BUN 15 09/14/2018 0320   CREATININE 0.89 09/14/2018 0320   CALCIUM 8.1 (L) 09/14/2018 0320   PROT 7.4 09/12/2018 1452   ALBUMIN 4.1 09/12/2018 1452   AST 26 09/12/2018 1452   ALT 57 (H) 09/12/2018 1452   ALKPHOS 64 09/12/2018 1452   BILITOT 1.2 09/12/2018 1452   GFRNONAA >60 09/14/2018 0320   GFRAA >60 09/14/2018 0320   Lipase     Component Value Date/Time   LIPASE 20 09/12/2018 1452       Studies/Results: No results found.  Anti-infectives: Anti-infectives (From admission, onward)   Start     Dose/Rate Route Frequency Ordered Stop   09/14/18 1800  amoxicillin-clavulanate (AUGMENTIN) 875-125 MG per tablet 1 tablet  Status:  Discontinued     1 tablet Oral Every 12 hours 09/14/18 0823 09/14/18 1008   09/14/18 1400  piperacillin-tazobactam (ZOSYN) IVPB 3.375 g     3.375 g 12.5 mL/hr over 240 Minutes Intravenous Every 8 hours 09/14/18 1029     09/12/18 2359  piperacillin-tazobactam (ZOSYN) IVPB 3.375 g  Status:  Discontinued     3.375 g 12.5 mL/hr over 240 Minutes Intravenous Every 8 hours 09/12/18 2303 09/14/18 0823   09/12/18 1915  cefTRIAXone (ROCEPHIN) 2 g in sodium chloride 0.9 % 100 mL IVPB     2 g 200 mL/hr over 30 Minutes Intravenous  Once 09/12/18 1907 09/12/18 2005   09/12/18 1915  metroNIDAZOLE (FLAGYL) IVPB 500 mg     500 mg 100 mL/hr over 60 Minutes Intravenous  Once 09/12/18 1907 09/12/18 2042       Assessment/Plan GERD HLD  Perforated appendicitis S/p Laparoscopic Appendectomy - Dr. Ninfa Linden - 09/12/2018 - POD #3 - Continue IV abx. Will need total of 10 days of abx. Await WBC normalization before transitioning to oral abx.  - Trend WBC, currently downtrending. If worsens, may need a repeat CT during the weekend.  - Await surgical pathology - Reports he is due for a colonoscopy this year. Recommended that he call his GI doctor to have this scheduled in 4-6 weeks.  - Mobilize and IS  FEN -Regular VTE -SCDs, Lovenox ID -Zosyn 9/1-9/3 >>   LOS: 3 days    Jillyn Ledger , [facility] 09/15/2018, 8:05 AM Pager: [phone]

## 2018-09-16 ENCOUNTER — Encounter (HOSPITAL_COMMUNITY): Payer: Self-pay | Admitting: Surgery

## 2018-09-16 DIAGNOSIS — K589 Irritable bowel syndrome without diarrhea: Secondary | ICD-10-CM | POA: Diagnosis present

## 2018-09-16 DIAGNOSIS — I10 Essential (primary) hypertension: Secondary | ICD-10-CM

## 2018-09-16 DIAGNOSIS — E669 Obesity, unspecified: Secondary | ICD-10-CM

## 2018-09-16 DIAGNOSIS — Z789 Other specified health status: Secondary | ICD-10-CM

## 2018-09-16 DIAGNOSIS — K219 Gastro-esophageal reflux disease without esophagitis: Secondary | ICD-10-CM | POA: Diagnosis present

## 2018-09-16 LAB — CBC
HCT: 38 % — ABNORMAL LOW (ref 39.0–52.0)
Hemoglobin: 12.2 g/dL — ABNORMAL LOW (ref 13.0–17.0)
MCH: 29.8 pg (ref 26.0–34.0)
MCHC: 32.1 g/dL (ref 30.0–36.0)
MCV: 92.9 fL (ref 80.0–100.0)
Platelets: 155 10*3/uL (ref 150–400)
RBC: 4.09 MIL/uL — ABNORMAL LOW (ref 4.22–5.81)
RDW: 12.7 % (ref 11.5–15.5)
WBC: 8.8 10*3/uL (ref 4.0–10.5)
nRBC: 0 % (ref 0.0–0.2)

## 2018-09-16 LAB — BASIC METABOLIC PANEL
Anion gap: 7 (ref 5–15)
BUN: 11 mg/dL (ref 6–20)
CO2: 23 mmol/L (ref 22–32)
Calcium: 8.2 mg/dL — ABNORMAL LOW (ref 8.9–10.3)
Chloride: 108 mmol/L (ref 98–111)
Creatinine, Ser: 0.94 mg/dL (ref 0.61–1.24)
GFR calc Af Amer: >60 mL/min (ref >60)
GFR calc non Af Amer: >60 mL/min (ref >60)
Glucose, Bld: 102 mg/dL — ABNORMAL HIGH (ref 70–99)
Potassium: 3.9 mmol/L (ref 3.5–5.1)
Sodium: 138 mmol/L (ref 135–145)

## 2018-09-16 MED ORDER — BISACODYL 10 MG RE SUPP: 10.0000 mg | Freq: Two times a day (BID) | RECTAL | Status: DC | PRN

## 2018-09-16 MED ORDER — MENTHOL 3 MG MT LOZG: 1.0000 | LOZENGE | OROMUCOSAL | Status: DC | PRN

## 2018-09-16 MED ORDER — METOPROLOL TARTRATE 12.5 MG HALF TABLET: 12.5000 mg | ORAL_TABLET | Freq: Two times a day (BID) | ORAL | Status: DC

## 2018-09-16 MED ORDER — PROCHLORPERAZINE EDISYLATE 10 MG/2ML IJ SOLN: 5.0000 mg | INTRAMUSCULAR | Status: DC | PRN

## 2018-09-16 MED ORDER — MAGIC MOUTHWASH
15.0000 mL | Freq: Four times a day (QID) | ORAL | Status: DC | PRN
  Filled 2018-09-16: qty 15

## 2018-09-16 MED ORDER — ZOLPIDEM TARTRATE 5 MG PO TABS
5.0000 mg | ORAL_TABLET | Freq: Every evening | ORAL | Status: DC | PRN
  Administered 2018-09-16: 5 mg via ORAL
  Filled 2018-09-16: qty 1

## 2018-09-16 MED ORDER — SIMETHICONE 40 MG/0.6ML PO SUSP
40.0000 mg | Freq: Four times a day (QID) | ORAL | Status: DC | PRN
  Filled 2018-09-16: qty 0.6

## 2018-09-16 MED ORDER — LIP MEDEX EX OINT: 1.0000 "application " | TOPICAL_OINTMENT | Freq: Two times a day (BID) | CUTANEOUS | Status: DC

## 2018-09-16 MED ORDER — LACTATED RINGERS IV BOLUS: 1000.0000 mL | Freq: Three times a day (TID) | INTRAVENOUS | Status: DC | PRN

## 2018-09-16 MED ORDER — ALUM & MAG HYDROXIDE-SIMETH 200-200-20 MG/5ML PO SUSP: 30.0000 mL | Freq: Four times a day (QID) | ORAL | Status: DC | PRN

## 2018-09-16 MED ORDER — PHENOL 1.4 % MT LIQD: 2.0000 | OROMUCOSAL | Status: DC | PRN

## 2018-09-16 MED ORDER — SODIUM CHLORIDE 0.9 % IV SOLN
25.0000 mg | Freq: Four times a day (QID) | INTRAVENOUS | Status: DC | PRN
  Filled 2018-09-16: qty 1

## 2018-09-16 MED ORDER — METOPROLOL TARTRATE 5 MG/5ML IV SOLN: 5.0000 mg | Freq: Four times a day (QID) | INTRAVENOUS | Status: DC | PRN

## 2018-09-16 MED ORDER — ENALAPRILAT 1.25 MG/ML IV SOLN
0.6250 mg | Freq: Four times a day (QID) | INTRAVENOUS | Status: DC | PRN
  Filled 2018-09-16: qty 1

## 2018-09-16 MED ORDER — ONDANSETRON HCL 4 MG/2ML IJ SOLN: 4.0000 mg | Freq: Four times a day (QID) | INTRAMUSCULAR | Status: DC | PRN

## 2018-09-16 MED ORDER — HYDRALAZINE HCL 20 MG/ML IJ SOLN
5.0000 mg | INTRAMUSCULAR | Status: DC | PRN
  Administered 2018-09-16: 5 mg via INTRAVENOUS
  Filled 2018-09-16: qty 1

## 2018-09-16 MED ORDER — METOPROLOL TARTRATE 12.5 MG HALF TABLET
12.5000 mg | ORAL_TABLET | Freq: Two times a day (BID) | ORAL | Status: DC
  Administered 2018-09-16 – 2018-09-17 (×2): 12.5 mg via ORAL
  Filled 2018-09-16 (×3): qty 1

## 2018-09-16 MED ORDER — SODIUM CHLORIDE 0.9 % IV SOLN
8.0000 mg | Freq: Four times a day (QID) | INTRAVENOUS | Status: DC | PRN
  Filled 2018-09-16: qty 4

## 2018-09-16 MED ORDER — FAMOTIDINE 20 MG PO TABS
40.0000 mg | ORAL_TABLET | Freq: Every day | ORAL | Status: DC
  Administered 2018-09-16 – 2018-09-17 (×2): 40 mg via ORAL
  Filled 2018-09-16 (×2): qty 2

## 2018-09-16 NOTE — Progress Notes (Signed)
     Assessment & Plan: POD#4 - Perforated appendicitis S/p Laparoscopic Appendectomy - Dr. Ninfa Linden - 09/12/2018 -Continue IV Zosyn.Will need total of 10 days of abx. - Trend WBC, currently downtrending, 8.8 this AM.  Repeat in AM 9/6. If worsens, may need a repeat CT during the weekend.  - Mobilize and IS - advance to full liquid diet this AM  Possible discharge tomorrow if diet tolerated and WBC remains normal.  Will switch to oral abx.        [name], MD       [facility]       Office: [phone]   Chief Complaint: Abdominal pain  Subjective: Patient in bed.  Feels better on clear liquid diet.  Having BM's.  Objective: Vital signs in last 24 hours: Temp:  [97.6 F (36.4 C)-100.8 F (38.2 C)] 97.9 F (36.6 C) (09/05 0617) Pulse Rate:  [71-92] 71 (09/05 0617) Resp:  [16-18] 16 (09/05 0617) BP: (129-151)/(90-100) 133/97 (09/05 0617) SpO2:  [95 %-100 %] 98 % (09/05 0617) Last BM Date: 09/15/18  Intake/Output from previous day: 09/04 0701 - 09/05 0700 In: 1268.7 [P.O.:480; I.V.:680; IV Piggyback:108.7] Out: -  Intake/Output this shift: No intake/output data recorded.  Physical Exam: HEENT - sclerae clear, mucous membranes moist Neck - soft Chest - clear bilaterally Cor - RRR Abdomen - protuberant, wounds dry and intact; active BS present Ext - no edema, non-tender Neuro - alert & oriented, no focal deficits  Lab Results:  Recent Labs    09/15/18 0259 09/16/18 0243  WBC 12.7* 8.8  HGB 12.9* 12.2*  HCT 40.8 38.0*  PLT 164 155   BMET Recent Labs    09/14/18 0320 09/16/18 0243  NA 139 138  K 3.9 3.9  CL 109 108  CO2 22 23  GLUCOSE 140* 102*  BUN 15 11  CREATININE 0.89 0.94  CALCIUM 8.1* 8.2*   PT/INR No results for input(s): LABPROT, INR in the last 72 hours. Comprehensive Metabolic Panel:    Component Value Date/Time   NA 138 09/16/2018 0243   NA 139 09/14/2018 0320   K 3.9 09/16/2018 0243   K 3.9 09/14/2018 0320    CL 108 09/16/2018 0243   CL 109 09/14/2018 0320   CO2 23 09/16/2018 0243   CO2 22 09/14/2018 0320   BUN 11 09/16/2018 0243   BUN 15 09/14/2018 0320   CREATININE 0.94 09/16/2018 0243   CREATININE 0.89 09/14/2018 0320   GLUCOSE 102 (H) 09/16/2018 0243   GLUCOSE 140 (H) 09/14/2018 0320   CALCIUM 8.2 (L) 09/16/2018 0243   CALCIUM 8.1 (L) 09/14/2018 0320   AST 26 09/12/2018 1452   ALT 57 (H) 09/12/2018 1452   ALKPHOS 64 09/12/2018 1452   BILITOT 1.2 09/12/2018 1452   PROT 7.4 09/12/2018 1452   ALBUMIN 4.1 09/12/2018 1452    Studies/Results: No results found.    [name] 09/16/2018  Patient ID: Rush Barer, male   DOB: Aug 27, 1965, 53 y.o.   MRN: [id number]

## 2018-09-16 NOTE — Plan of Care (Signed)
  Problem: Clinical Measurements: Goal: Diagnostic test results will improve Outcome: Progressing   Problem: Activity: Goal: Risk for activity intolerance will decrease Outcome: Progressing   Problem: Nutrition: Goal: Adequate nutrition will be maintained Outcome: Progressing   Problem: Coping: Goal: Level of anxiety will decrease Outcome: Progressing   Problem: Elimination: Goal: Will not experience complications related to bowel motility Outcome: Progressing

## 2018-09-17 LAB — CBC
HCT: 43.1 % (ref 39.0–52.0)
Hemoglobin: 14 g/dL (ref 13.0–17.0)
MCH: 30.1 pg (ref 26.0–34.0)
MCHC: 32.5 g/dL (ref 30.0–36.0)
MCV: 92.7 fL (ref 80.0–100.0)
Platelets: 203 10*3/uL (ref 150–400)
RBC: 4.65 MIL/uL (ref 4.22–5.81)
RDW: 12.6 % (ref 11.5–15.5)
WBC: 8.3 10*3/uL (ref 4.0–10.5)
nRBC: 0 % (ref 0.0–0.2)

## 2018-09-17 LAB — POTASSIUM: Potassium: 3.9 mmol/L (ref 3.5–5.1)

## 2018-09-17 MED ORDER — TRAMADOL HCL 50 MG PO TABS: 50.0000 mg | ORAL_TABLET | Freq: Four times a day (QID) | ORAL | 0 refills | Status: DC | PRN

## 2018-09-17 MED ORDER — AMOXICILLIN-POT CLAVULANATE 875-125 MG PO TABS: 1.0000 | ORAL_TABLET | Freq: Two times a day (BID) | ORAL | 0 refills | Status: AC

## 2018-09-17 NOTE — Progress Notes (Signed)
     Assessment & Plan: POD#5 - Perforated appendicitis S/p Laparoscopic Appendectomy - Dr. Ninfa Linden - 09/12/2018  Doing well overnight, diet tolerated, minimal pain  Having normal BM's this AM  WBC continues to fall - 8.3 this AM, afebrile  Discharge home this AM - oral Augmentin, Tramadol for pain prn  Follow up with Dr. Ninfa Linden at [facility] office in 2 weeks.  Discussed reasons to call office / on-call MD - increasing pain, fever, chills, etc.  Discussed diet.        Jason Gemma, MD       [facility]       Office: [phone]   Chief Complaint: appendicitis  Subjective: Patient doing well this AM, wants to go home.  Objective: Vital signs in last 24 hours: Temp:  [97.7 F (36.5 C)-98.6 F (37 C)] 98.5 F (36.9 C) (09/06 0400) Pulse Rate:  [76-87] 81 (09/06 0400) Resp:  [16-18] 18 (09/06 0400) BP: (143-155)/(100-110) 146/101 (09/06 0400) SpO2:  [96 %-98 %] 96 % (09/06 0400) Last BM Date: 09/16/18  Intake/Output from previous day: 09/05 0701 - 09/06 0700 In: 678.1 [P.O.:360; I.V.:149.9; IV Piggyback:168.3] Out: -  Intake/Output this shift: No intake/output data recorded.  Physical Exam: HEENT - sclerae clear, mucous membranes moist Neck - soft Abdomen - soft without distension; non-tender; no mass; wounds dry and intact Ext - no edema, non-tender Neuro - alert & oriented, no focal deficits  Lab Results:  Recent Labs    09/16/18 0243 09/17/18 0358  WBC 8.8 8.3  HGB 12.2* 14.0  HCT 38.0* 43.1  PLT 155 203   BMET Recent Labs    09/16/18 0243 09/17/18 0358  NA 138  --   K 3.9 3.9  CL 108  --   CO2 23  --   GLUCOSE 102*  --   BUN 11  --   CREATININE 0.94  --   CALCIUM 8.2*  --    PT/INR No results for input(s): LABPROT, INR in the last 72 hours. Comprehensive Metabolic Panel:    Component Value Date/Time   NA 138 09/16/2018 0243   NA 139 09/14/2018 0320   K 3.9 09/17/2018 0358   K 3.9 09/16/2018 0243   CL 108 09/16/2018 0243    CL 109 09/14/2018 0320   CO2 23 09/16/2018 0243   CO2 22 09/14/2018 0320   BUN 11 09/16/2018 0243   BUN 15 09/14/2018 0320   CREATININE 0.94 09/16/2018 0243   CREATININE 0.89 09/14/2018 0320   GLUCOSE 102 (H) 09/16/2018 0243   GLUCOSE 140 (H) 09/14/2018 0320   CALCIUM 8.2 (L) 09/16/2018 0243   CALCIUM 8.1 (L) 09/14/2018 0320   AST 26 09/12/2018 1452   ALT 57 (H) 09/12/2018 1452   ALKPHOS 64 09/12/2018 1452   BILITOT 1.2 09/12/2018 1452   PROT 7.4 09/12/2018 1452   ALBUMIN 4.1 09/12/2018 1452    Studies/Results: No results found.    Jason Price 09/17/2018  Patient ID: Jason Price, male   DOB: 06/05/1965, 53 y.o.   MRN: [id number]

## 2018-09-17 NOTE — Discharge Instructions (Signed)
[facility]  LAPAROSCOPIC SURGERY:  POST-OP INSTRUCTIONS  Always review your discharge instruction sheet given to you by the facility where your surgery was performed.  A prescription for pain medication may be given to you upon discharge.  Take your pain medication as prescribed.  If narcotic pain medicine is not needed, then you may take acetaminophen (Tylenol) or ibuprofen (Advil) as needed.  Take your usually prescribed medications unless otherwise directed.  If you need a refill on your pain medication, please contact your pharmacy.  They will contact our office to request authorization. Prescriptions will not be filled after 5 P.M. or on weekends.  You should follow a light diet the first few days after arrival home, such as soup and crackers or toast.  Be sure to include plenty of fluids daily.  Most patients will experience some swelling and bruising in the area of the incisions.  Ice packs will help.  Swelling and bruising can take several days to resolve.   It is common to experience some constipation after surgery.  Increasing fluid intake and taking a stool softener (such as Colace) will usually help or prevent this problem from occurring.  A mild laxative (Milk of Magnesia or Miralax) should be taken according to package instructions if there has been no bowel movement after 48 hours.  You will likely have Dermabond (topical glue) over your incisions.  This seals the incisions and allows you to bathe and shower at any time after your surgery.  Glue should remain in place for up to 10 days.  It may be removed after 10 days by pealing off the Dermabond material or using Vaseline or naval jelly to remove.  If you have steri-strips over your incisions, you may remove the gauze bandage on the second day after surgery, and you may shower at that time.  Leave your steri-strips (small skin tapes) in place directly over the incision.  These strips should remain on the  skin for 5-7 days and then be removed.  You may get them wet in the shower and pat them dry.  Any sutures or staples will be removed at the office during your follow-up visit.  ACTIVITIES:  You may resume regular (light) daily activities beginning the next day - such as daily self-care, walking, climbing stairs - gradually increasing activities as tolerated.  You may have sexual intercourse when it is comfortable.  Refrain from any heavy lifting or straining until approved by your doctor.  You may drive when you are no longer taking prescription pain medication, when you can comfortably wear a seatbelt, and when you can safely maneuver your car and apply brakes.  You should see your doctor in the office for a follow-up appointment approximately 2-3 weeks after your surgery.  Make sure that you call for this appointment within a day or two after you arrive home to insure a convenient appointment time.  WHEN TO CALL YOUR DOCTOR: 1. Fever over 101.0 2. Inability to urinate 3. Continued bleeding from incision 4. Increased pain, redness, or drainage from the incision 5. Increasing abdominal pain  The clinic staff is available to answer your questions during regular business hours.  Please don't hesitate to call and ask to speak to one of the nurses for clinical concerns.  If you have a medical emergency, go to the nearest emergency room or call 911.  A surgeon from [facility] is always on call for the hospital.  Charlean Carneal M. Yaneth Fairbairn, MD, FACS [facility] Office: [phone] Toll Free:  [phone] FAX [phone]  Website: [email or web address] 

## 2018-09-17 NOTE — Plan of Care (Signed)
  Problem: Clinical Measurements: Goal: Diagnostic test results will improve Outcome: Progressing   Problem: Clinical Measurements: Goal: Cardiovascular complication will be avoided Outcome: Progressing   Problem: Nutrition: Goal: Adequate nutrition will be maintained Outcome: Progressing   Problem: Pain Managment: Goal: General experience of comfort will improve Outcome: Progressing   Problem: Safety: Goal: Ability to remain free from injury will improve Outcome: Progressing   Problem: Skin Integrity: Goal: Risk for impaired skin integrity will decrease Outcome: Progressing

## 2018-09-22 NOTE — Discharge Summary (Signed)
    Patient ID: Jason Price [id number] 02-10-1965 53 y.o.  Admit date: 09/12/2018 Discharge date: 09/17/2018  Admitting Diagnosis: Acute appendicitis  Discharge Diagnosis Patient Active Problem List   Diagnosis Date Noted  . HTN (hypertension) 09/16/2018  . Moderate alcohol consumption 09/16/2018  . Obesity (BMI 30-39.9) 09/16/2018  . IBS (irritable bowel syndrome)   . GERD (gastroesophageal reflux disease)   . Perforated appendicitis 09/12/2018  . Chronic insomnia   . Chest pain 02/01/2013  . Hypersomnia with sleep apnea 05/15/2009  . Hyperlipemia, mixed 04/08/2009  . TOBACCO USER 04/08/2009  . Other malaise and fatigue 04/08/2009    Consultants None   H&P: This is a 53 year old gentleman who presents with right-sided abdominal pain.  He reports that the pain woke him up this morning at 2-3 AM.  He describes the pain is sharp and severe as well as constant.  He has had no nausea or vomiting.  The pain is been in the right lower quadrant.  He has no fevers or chills and no nausea or vomiting.  He underwent a CT scan of the abdomen and pelvis showing findings consistent with acute appendicitis.  His COVID test is negative.  He is otherwise without complaints.   Procedures Laparoscopic Appendectomy - Dr. Ninfa Linden - 09/12/2018  Hospital Course:  The patient was admitted and underwent a laparoscopic appendectomy for perforated appendicitis. The patient tolerated the procedure well and was transferred back to the floor.  He was kept on IV abx for several days as his WBC downtrended. He did develop a mild post operative ileus that quickly resolved. His diet was advanced and tolerated. His pathology came back as "small segment of enteric tissue, compatible with appendix". On POD 5, the patient was tolerating a regular diet, voiding well, mobilizing, and pain was controlled with oral pain medications.  The patient was stable for DC home on abx and instructed to call the office for follow  up.    Physical Exam: Please see MD's progress note. I was not present during the time of discharge. Information above was taken from chart review.   Allergies as of 09/17/2018      Reactions   Niaspan [niacin Er]    Flushing   Simvastatin    Muscle aches      Medication List    TAKE these medications   amoxicillin-clavulanate 875-125 MG tablet Commonly known as: Augmentin Take 1 tablet by mouth every 12 (twelve) hours for 7 days.   Belsomra 10 MG Tabs Generic drug: Suvorexant TAKE 1 TABLET BY MOUTH EVERY NIGHT AT BEDTIME   famotidine 40 MG tablet Commonly known as: PEPCID Take 40 mg by mouth daily.   traMADol 50 MG tablet Commonly known as: ULTRAM Take 1-2 tablets (50-100 mg total) by mouth every 6 (six) hours as needed.   zaleplon 10 MG capsule Commonly known as: Sonata Take 1 capsule (10 mg total) by mouth at bedtime as needed for sleep.        Follow-up Information    Coralie Keens, MD. Schedule an appointment as soon as possible for a visit in 2 week(s).   Specialty: General Surgery Contact information: [address] [place] [address] [phone]           Signed: Alferd Apa, [facility] 09/22/2018, 2:57 PM Pager: [phone]

## 2018-10-06 ENCOUNTER — Other Ambulatory Visit: Payer: Self-pay | Admitting: Surgery

## 2018-10-06 DIAGNOSIS — Z9049 Acquired absence of other specified parts of digestive tract: Secondary | ICD-10-CM

## 2018-10-18 ENCOUNTER — Ambulatory Visit
Admission: RE | Admit: 2018-10-18 | Discharge: 2018-10-18 | Disposition: A | Discharge status: Home / Self Care | Payer: BC Managed Care – PPO | Source: Ambulatory Visit | Attending: Surgery | Admitting: Surgery

## 2018-10-18 DIAGNOSIS — Z9049 Acquired absence of other specified parts of digestive tract: Secondary | ICD-10-CM

## 2018-10-18 MED ORDER — IOPAMIDOL (ISOVUE-300) INJECTION 61%
125.0000 mL | Freq: Once | INTRAVENOUS | Status: AC | PRN
  Administered 2018-10-18: 125 mL via INTRAVENOUS

## 2018-11-16 ENCOUNTER — Other Ambulatory Visit: Payer: Self-pay

## 2018-11-16 DIAGNOSIS — Z20822 Contact with and (suspected) exposure to covid-19: Secondary | ICD-10-CM

## 2018-11-17 LAB — NOVEL CORONAVIRUS, NAA: SARS-CoV-2, NAA: NOT DETECTED

## 2019-04-04 ENCOUNTER — Ambulatory Visit (HOSPITAL_COMMUNITY)
Admission: RE | Admit: 2019-04-04 | Discharge: 2019-04-04 | Disposition: A | Discharge status: Home / Self Care | Payer: BC Managed Care – PPO | Source: Ambulatory Visit | Attending: Surgery | Admitting: Surgery

## 2019-04-04 ENCOUNTER — Other Ambulatory Visit: Payer: Self-pay

## 2019-04-04 ENCOUNTER — Other Ambulatory Visit (HOSPITAL_COMMUNITY): Payer: Self-pay | Admitting: Surgery

## 2019-04-04 ENCOUNTER — Encounter (HOSPITAL_COMMUNITY): Payer: Self-pay

## 2019-04-04 ENCOUNTER — Other Ambulatory Visit: Payer: Self-pay | Admitting: Surgery

## 2019-04-04 DIAGNOSIS — K37 Unspecified appendicitis: Secondary | ICD-10-CM

## 2019-04-04 MED ORDER — IOHEXOL 300 MG/ML  SOLN
100.0000 mL | Freq: Once | INTRAMUSCULAR | Status: AC | PRN
  Administered 2019-04-04: 100 mL via INTRAVENOUS

## 2019-04-04 MED ORDER — SODIUM CHLORIDE (PF) 0.9 % IJ SOLN
INTRAMUSCULAR | Status: AC
  Filled 2019-04-04: qty 50

## 2019-04-04 MED ORDER — IOHEXOL 9 MG/ML PO SOLN
1000.0000 mL | ORAL | Status: AC
  Administered 2019-04-04: 15:00:00 1000 mL via ORAL

## 2019-04-04 MED ORDER — IOHEXOL 9 MG/ML PO SOLN
ORAL | Status: AC
  Filled 2019-04-04: qty 1000

## 2020-10-15 ENCOUNTER — Other Ambulatory Visit (HOSPITAL_COMMUNITY): Payer: Self-pay | Admitting: Internal Medicine

## 2020-10-15 ENCOUNTER — Other Ambulatory Visit: Payer: Self-pay | Admitting: Internal Medicine

## 2020-10-15 DIAGNOSIS — M5416 Radiculopathy, lumbar region: Secondary | ICD-10-CM

## 2020-10-15 DIAGNOSIS — R0789 Other chest pain: Secondary | ICD-10-CM

## 2020-10-22 ENCOUNTER — Ambulatory Visit: Payer: BC Managed Care – PPO

## 2021-06-03 ENCOUNTER — Other Ambulatory Visit: Payer: Self-pay | Admitting: Internal Medicine

## 2021-06-03 DIAGNOSIS — R0789 Other chest pain: Secondary | ICD-10-CM

## 2021-06-03 DIAGNOSIS — Z720 Tobacco use: Secondary | ICD-10-CM

## 2021-06-03 DIAGNOSIS — R9389 Abnormal findings on diagnostic imaging of other specified body structures: Secondary | ICD-10-CM

## 2021-06-25 ENCOUNTER — Ambulatory Visit
Admission: RE | Admit: 2021-06-25 | Discharge: 2021-06-25 | Disposition: A | Discharge status: Home / Self Care | Payer: Commercial Managed Care - PPO | Source: Ambulatory Visit | Attending: Internal Medicine | Admitting: Internal Medicine

## 2021-06-25 DIAGNOSIS — R9389 Abnormal findings on diagnostic imaging of other specified body structures: Secondary | ICD-10-CM

## 2021-06-25 DIAGNOSIS — R0789 Other chest pain: Secondary | ICD-10-CM

## 2021-06-25 DIAGNOSIS — Z720 Tobacco use: Secondary | ICD-10-CM

## 2021-06-25 MED ORDER — IOPAMIDOL (ISOVUE-300) INJECTION 61%
75.0000 mL | Freq: Once | INTRAVENOUS | Status: AC | PRN
  Administered 2021-06-25: 75 mL via INTRAVENOUS

## 2021-07-31 IMAGING — CT CT ABD-PELV W/ CM
2 of 5 series · 16 of 46 positions shown, 18 images · IV contrast (APPLIED)
Comparison: CT scan 10/18/2018

CLINICAL DATA: Right lower quadrant abdominal pain for 2 days.
History of attempted laparoscopic appendectomy 09/12/2018

EXAM:
CT ABDOMEN AND PELVIS WITH CONTRAST
TECHNIQUE: Multidetector CT imaging of the abdomen and pelvis was performed
using the standard protocol following bolus administration of
intravenous contrast.
CONTRAST:  100mL OMNIPAQUE IOHEXOL 300 MG/ML  SOLN

[Series 2: axial st · axial · 0.84mm/px · z∈[-668,-213]mm · 13 of 106 slices shown, 15 images]
[im 8/106  soft-tissue]
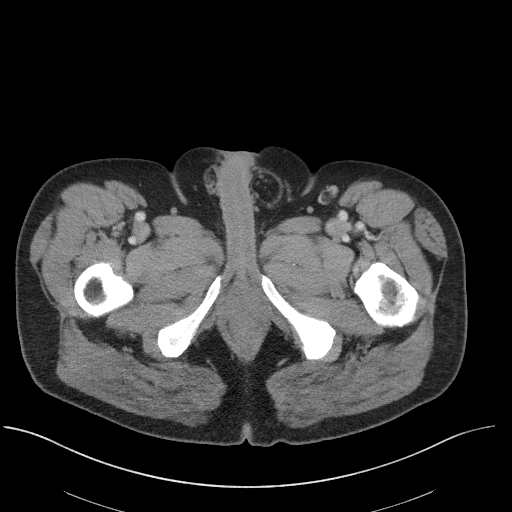
[im 8/106  bone]
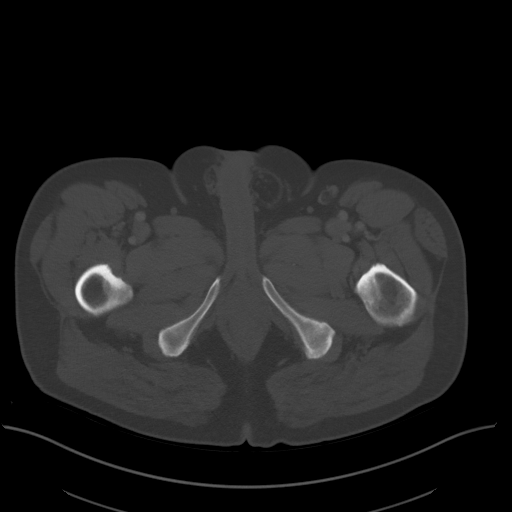
[im 15/106  soft-tissue]
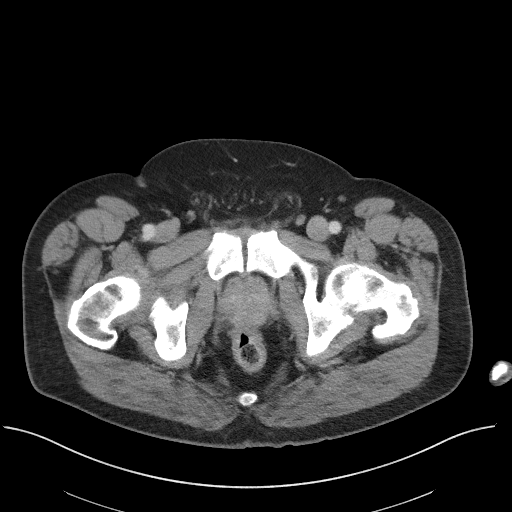
[im 22/106  soft-tissue]
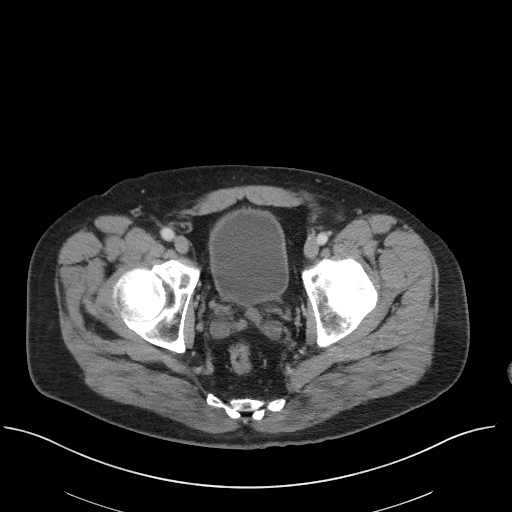
[im 29/106  soft-tissue]
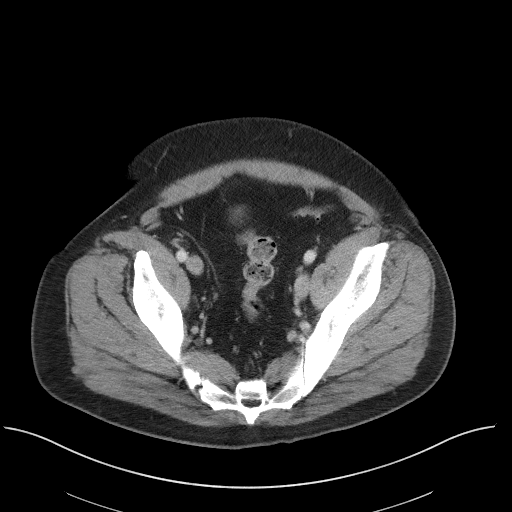
[im 36/106  soft-tissue]
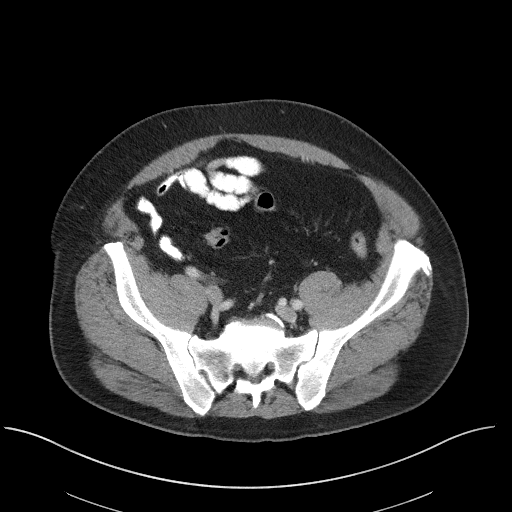
[im 43/106  soft-tissue]
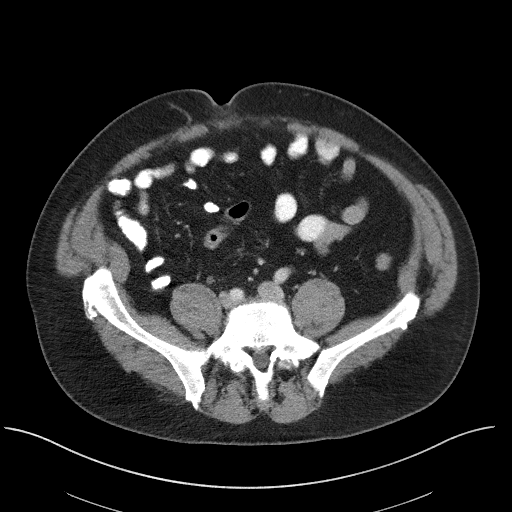
[im 57/106  soft-tissue]
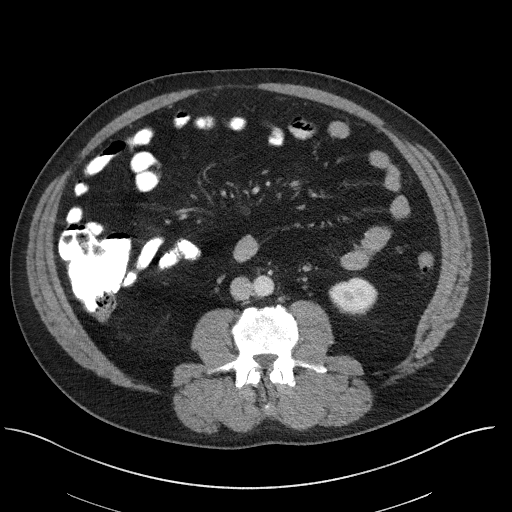
[im 64/106  soft-tissue]
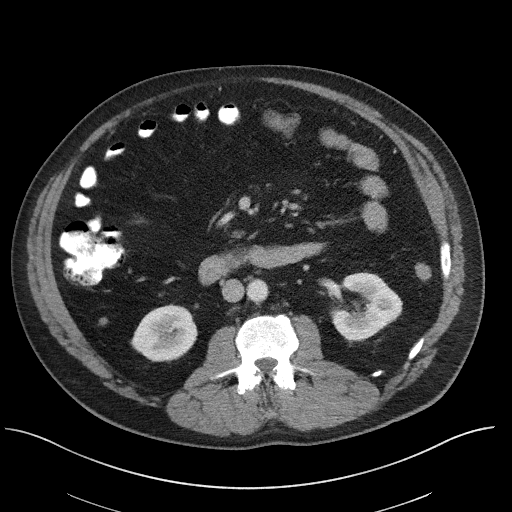
[im 71/106  soft-tissue]
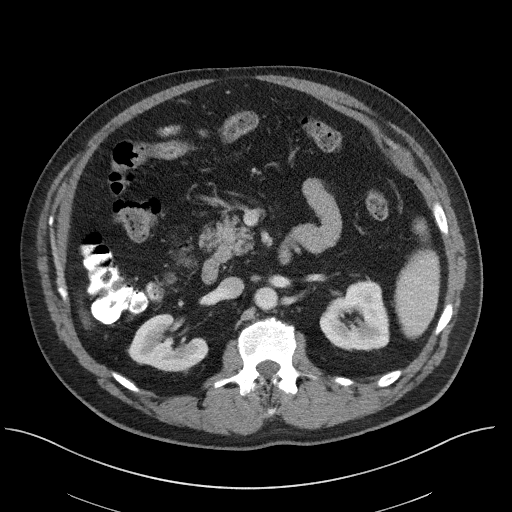
[im 71/106  bone]
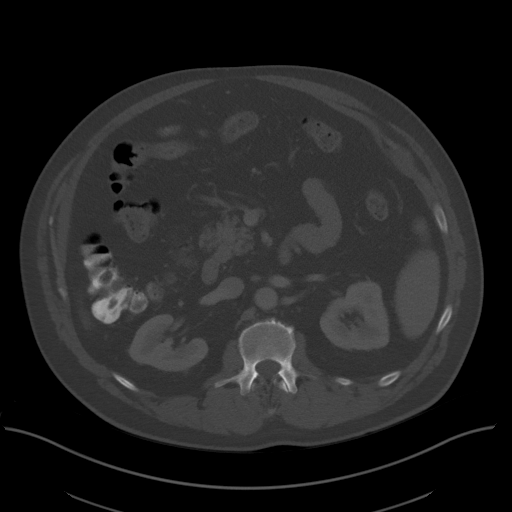
[im 78/106  soft-tissue]
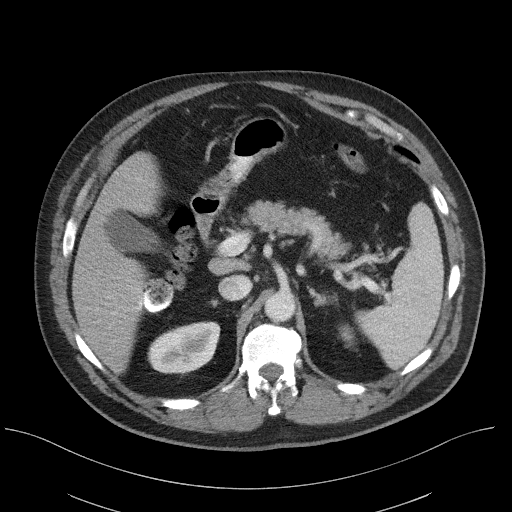
[im 85/106  soft-tissue]
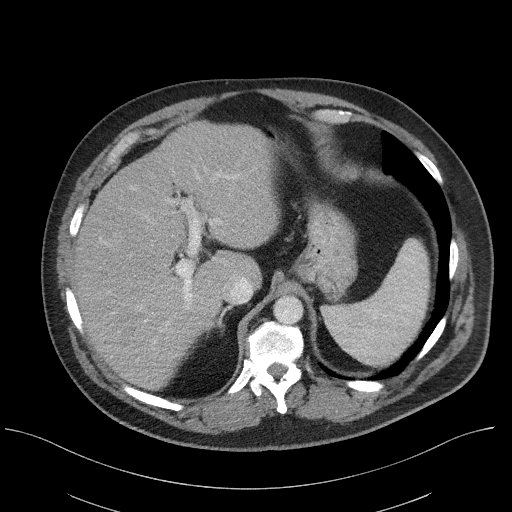
[im 92/106  soft-tissue]
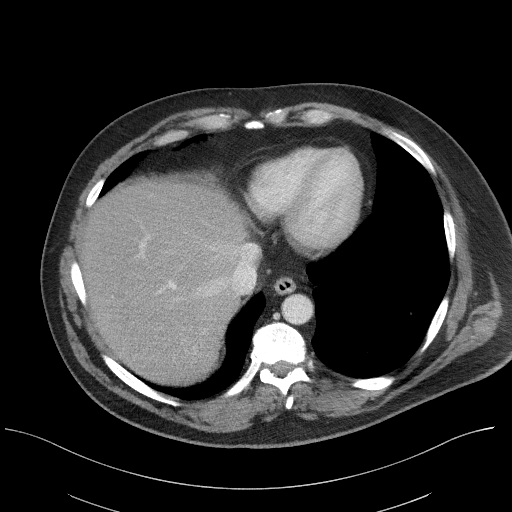
[im 99/106  soft-tissue]
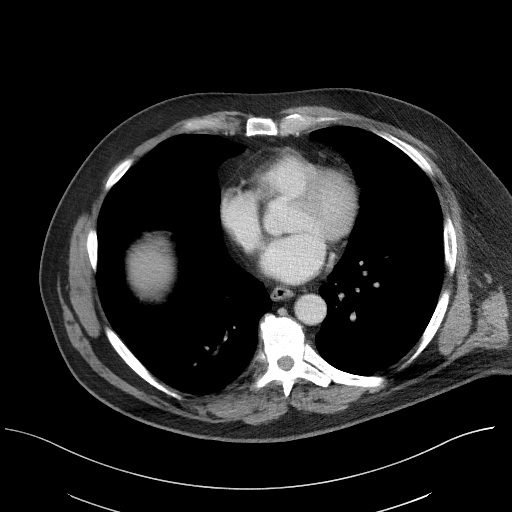

[Series 5: coronal st · coronal · 0.82mm/px · 3 of 107 slices shown]
[im 36/107  soft-tissue]
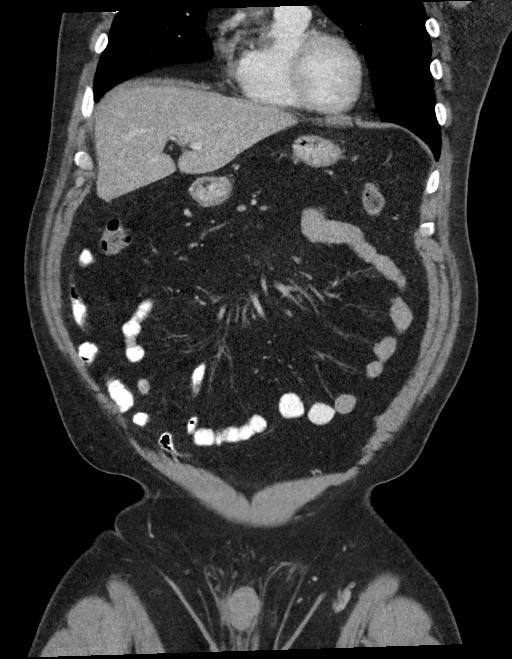
[im 48/107  soft-tissue]
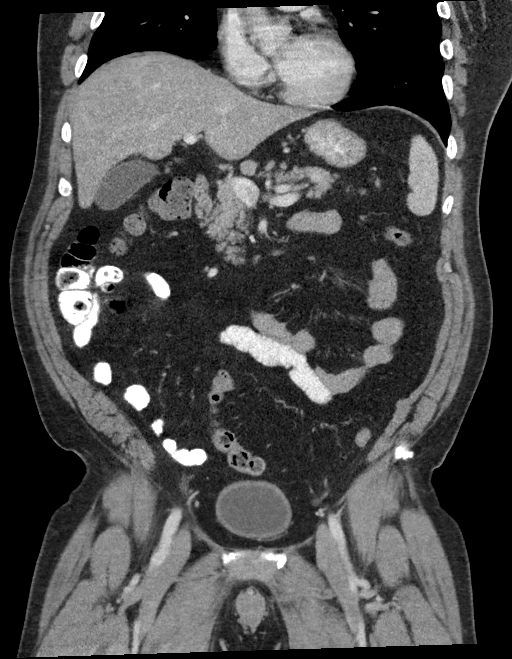
[im 59/107  soft-tissue]
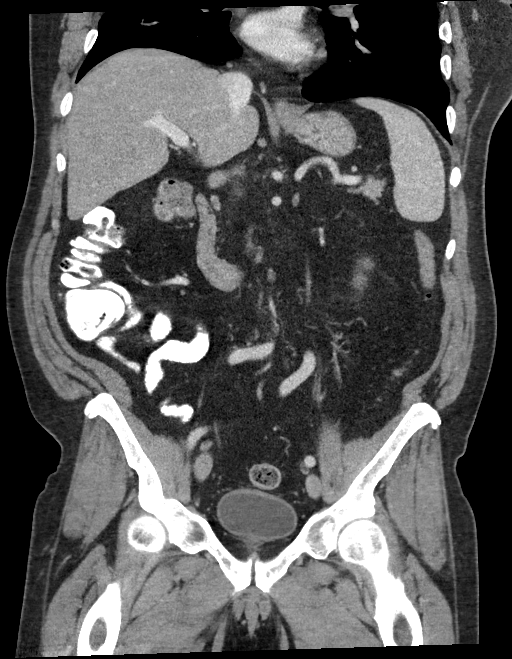

[16 of 46 positions shown; findings below may reference images not displayed]

FINDINGS: Lower chest: The lung bases are clear of an acute process. No
pleural effusion. No worrisome pulmonary lesions. The heart is
normal in size. No pericardial effusion. The distal esophagus is
grossly normal.

Hepatobiliary: No focal hepatic lesions or intrahepatic biliary
dilatation. The gallbladder appears normal. No common bile duct
dilatation. The portal and hepatic veins are patent.

Pancreas: No mass, inflammation or ductal dilatation.

Spleen: Normal size. No focal lesions.

Adrenals/Urinary Tract: The adrenal glands and kidneys are
unremarkable. The bladder is normal.

Stomach/Bowel: The stomach, duodenum, small bowel and colon are
unremarkable. No acute inflammatory changes, mass lesions or
obstructive findings. The terminal ileum is normal. The appendix is
visualized and appears normal. Scattered descending colon and
sigmoid colon diverticulosis but no findings for acute
diverticulitis.

Vascular/Lymphatic: The aorta and branch vessels are normal. The
major venous structures are patent. Stable borderline celiac axis
and paraduodenal lymph nodes. No mesenteric or retroperitoneal mass
or adenopathy.

Reproductive: The prostate gland and seminal vesicles are
unremarkable and stable.

Other: Small inguinal hernias containing fat. No free
abdominal/pelvic fluid collections or pelvic abscess. Small
periumbilical abdominal wall hernia containing fat.

Musculoskeletal: No significant bony findings.
IMPRESSION: 1. No acute abdominal/pelvic findings, mass lesions or adenopathy.
2. The appendix is visualized and appears normal.
3. Scattered colonic diverticulosis but no findings for acute
diverticulitis.

## 2021-09-28 ENCOUNTER — Emergency Department (HOSPITAL_BASED_OUTPATIENT_CLINIC_OR_DEPARTMENT_OTHER): Payer: Commercial Managed Care - PPO

## 2021-09-28 ENCOUNTER — Other Ambulatory Visit: Payer: Self-pay

## 2021-09-28 ENCOUNTER — Encounter (HOSPITAL_BASED_OUTPATIENT_CLINIC_OR_DEPARTMENT_OTHER): Payer: Self-pay

## 2021-09-28 ENCOUNTER — Emergency Department (HOSPITAL_BASED_OUTPATIENT_CLINIC_OR_DEPARTMENT_OTHER)
Admission: EM | Admit: 2021-09-28 | Discharge: 2021-09-28 | Disposition: A | Discharge status: Home / Self Care | Payer: Commercial Managed Care - PPO | Attending: Emergency Medicine | Admitting: Emergency Medicine

## 2021-09-28 DIAGNOSIS — R1084 Generalized abdominal pain: Secondary | ICD-10-CM

## 2021-09-28 DIAGNOSIS — R1031 Right lower quadrant pain: Secondary | ICD-10-CM | POA: Diagnosis present

## 2021-09-28 LAB — URINALYSIS, ROUTINE W REFLEX MICROSCOPIC
Bilirubin Urine: NEGATIVE
Glucose, UA: NEGATIVE mg/dL
Hgb urine dipstick: NEGATIVE
Ketones, ur: NEGATIVE mg/dL
Leukocytes,Ua: NEGATIVE
Nitrite: NEGATIVE
Protein, ur: NEGATIVE mg/dL
Specific Gravity, Urine: 1.024 (ref 1.005–1.030)
pH: 5.5 (ref 5.0–8.0)

## 2021-09-28 LAB — COMPREHENSIVE METABOLIC PANEL
ALT: 38 U/L (ref 0–44)
AST: 26 U/L (ref 15–41)
Albumin: 4.7 g/dL (ref 3.5–5.0)
Alkaline Phosphatase: 67 U/L (ref 38–126)
Anion gap: 10 (ref 5–15)
BUN: 15 mg/dL (ref 6–20)
CO2: 24 mmol/L (ref 22–32)
Calcium: 9.7 mg/dL (ref 8.9–10.3)
Chloride: 106 mmol/L (ref 98–111)
Creatinine, Ser: 0.92 mg/dL (ref 0.61–1.24)
GFR, Estimated: >60 mL/min (ref >60)
Glucose, Bld: 102 mg/dL — ABNORMAL HIGH (ref 70–99)
Potassium: 4.4 mmol/L (ref 3.5–5.1)
Sodium: 140 mmol/L (ref 135–145)
Total Bilirubin: 0.6 mg/dL (ref 0.3–1.2)
Total Protein: 7.3 g/dL (ref 6.5–8.1)

## 2021-09-28 LAB — CBC WITH DIFFERENTIAL/PLATELET
Abs Immature Granulocytes: 0.03 10*3/uL (ref 0.00–0.07)
Basophils Absolute: 0.1 10*3/uL (ref 0.0–0.1)
Basophils Relative: 1 %
Eosinophils Absolute: 0.4 10*3/uL (ref 0.0–0.5)
Eosinophils Relative: 4 %
HCT: 45.6 % (ref 39.0–52.0)
Hemoglobin: 15.8 g/dL (ref 13.0–17.0)
Immature Granulocytes: 0 %
Lymphocytes Relative: 23 %
Lymphs Abs: 2 10*3/uL (ref 0.7–4.0)
MCH: 30.6 pg (ref 26.0–34.0)
MCHC: 34.6 g/dL (ref 30.0–36.0)
MCV: 88.2 fL (ref 80.0–100.0)
Monocytes Absolute: 0.7 10*3/uL (ref 0.1–1.0)
Monocytes Relative: 8 %
Neutro Abs: 5.7 10*3/uL (ref 1.7–7.7)
Neutrophils Relative %: 64 %
Platelets: 191 10*3/uL (ref 150–400)
RBC: 5.17 MIL/uL (ref 4.22–5.81)
RDW: 12.8 % (ref 11.5–15.5)
WBC: 8.8 10*3/uL (ref 4.0–10.5)
nRBC: 0 % (ref 0.0–0.2)

## 2021-09-28 LAB — TROPONIN I (HIGH SENSITIVITY): Troponin I (High Sensitivity): 2 ng/L (ref <18)

## 2021-09-28 LAB — LIPASE, BLOOD: Lipase: 14 U/L (ref 11–51)

## 2021-09-28 MED ORDER — PANTOPRAZOLE SODIUM 20 MG PO TBEC: 40.0000 mg | DELAYED_RELEASE_TABLET | Freq: Two times a day (BID) | ORAL | 0 refills | Status: DC

## 2021-09-28 MED ORDER — ONDANSETRON HCL 4 MG/2ML IJ SOLN
4.0000 mg | Freq: Once | INTRAMUSCULAR | Status: AC
Start: 2021-09-28 — End: 2021-09-28
  Administered 2021-09-28: 4 mg via INTRAVENOUS
  Filled 2021-09-28: qty 2

## 2021-09-28 MED ORDER — OXYCODONE-ACETAMINOPHEN 5-325 MG PO TABS
1.0000 | ORAL_TABLET | ORAL | Status: DC | PRN
  Administered 2021-09-28: 1 via ORAL
  Filled 2021-09-28: qty 1

## 2021-09-28 NOTE — ED Notes (Signed)
Patient verbalizes understanding of discharge instructions. Opportunity for questioning and answers were provided. Patient discharged from ED.  °

## 2021-09-28 NOTE — Discharge Instructions (Addendum)
You were seen today for abdominal pain.  Your evaluation did not reveal any acute emergent conditions.  We discussed proceeding with a contrasted CT scan for appendicitis versus observational care with increased dose of pantoprazole. We decided to plan to take 40 mg of pantoprazole twice daily and follow-up with primary care provider for reassessment. Thank you for the opportunity to participate in your care, please return to the emergency department if your abdominal pain returns or worsens. Tretha Sciara MD

## 2021-09-28 NOTE — ED Triage Notes (Signed)
Pt presents POV from home, ca/ox4, ambulatory in triage  Pt reports Right flank pain starting this am. Pt has a hx of the same and was his appendix, states they were unable to remove his appendex d/t it was "turned funny"  Denies any hx of kidney stones, no urinary concerns, denies N/V/D or fever

## 2021-09-28 NOTE — ED Provider Notes (Signed)
[name] Provider Note   CSN: [id number] Arrival date & time: 09/28/21  1224     History Chief Complaint  Patient presents with   Flank Pain    HPI Jason Price is a 56 y.o. male presenting for right lower quadrant abdominal pain.  He states that since yesterday, he has had right lower quadrant abdominal pain.  He has a history of similar and a prior diagnosis of appendicitis that required laparoscopic evaluation.  Unfortunately he had a retrocecal appendix and they were unable to complete the procedure as patient did not want an open procedure. He was treated with IV antibiotics x5 days with expectant management and ultimately had resolution of his symptoms.  Yesterday, he endorses recurrence of the symptoms with right lower quadrant pain.  He denies fevers or chills nausea vomiting syncope or shortness of breath.  He states his pain seems to be improving today.   Patient's recorded medical, surgical, social, medication list and allergies were reviewed in the Snapshot window as part of the initial history.   Review of Systems   Review of Systems  Constitutional:  Negative for chills and fever.  HENT:  Negative for ear pain and sore throat.   Eyes:  Negative for pain and visual disturbance.  Respiratory:  Negative for cough and shortness of breath.   Cardiovascular:  Negative for chest pain and palpitations.  Gastrointestinal:  Positive for abdominal pain. Negative for vomiting.  Genitourinary:  Negative for dysuria and hematuria.  Musculoskeletal:  Negative for arthralgias and back pain.  Skin:  Negative for color change and rash.  Neurological:  Negative for seizures and syncope.  All other systems reviewed and are negative.   Physical Exam Updated Vital Signs BP (!) 141/101   Pulse 65   Temp 97.8 F (36.6 C)   Resp 15   SpO2 97%  Physical Exam Vitals and nursing note reviewed.  Constitutional:      General: He is not in acute  distress.    Appearance: He is well-developed.  HENT:     Head: Normocephalic and atraumatic.  Eyes:     Conjunctiva/sclera: Conjunctivae normal.  Cardiovascular:     Rate and Rhythm: Normal rate and regular rhythm.     Heart sounds: No murmur heard. Pulmonary:     Effort: Pulmonary effort is normal. No respiratory distress.     Breath sounds: Normal breath sounds.  Abdominal:     Palpations: Abdomen is soft.     Tenderness: There is abdominal tenderness. There is guarding. There is no right CVA tenderness or left CVA tenderness.  Musculoskeletal:        General: No swelling.     Cervical back: Neck supple.  Skin:    General: Skin is warm and dry.     Capillary Refill: Capillary refill takes less than 2 seconds.  Neurological:     Mental Status: He is alert.  Psychiatric:        Mood and Affect: Mood normal.      ED Course/ Medical Decision Making/ A&P    Procedures Procedures   Medications Ordered in ED Medications  oxyCODONE-acetaminophen (PERCOCET/ROXICET) 5-325 MG per tablet 1 tablet (1 tablet Oral Given 09/28/21 1429)  ondansetron (ZOFRAN) injection 4 mg (4 mg Intravenous Given 09/28/21 1429)   Medical Decision Making:   [name] is a 56 y.o. male who presented to the ED today with abdominal pain, detailed above.    Patient's presentation is complicated by  their history of multiple comorbid medical problems.  Patient placed on continuous vitals and telemetry monitoring while in ED which was reviewed periodically.  Complete initial physical exam performed, notably the patient was HDS in NAD.     Reviewed and confirmed nursing documentation for past medical history, family history, social history.    Initial Assessment:   With the patient's presentation of abdominal pain, most likely diagnosis is nonspecific abdominal pain. Other diagnoses were considered including (but not limited to) gastroenteritis, colitis, small bowel obstruction, appendicitis,  cholecystitis, pancreatitis, nephrolithiasis, UTI, pyleonephritis, testicular torsion. These are considered less likely due to history of present illness and physical exam findings.   This is most consistent with an acute life/limb threatening illness complicated by underlying chronic conditions.   Initial Plan:  CBC/CMP to evaluate for underlying infectious/metabolic etiology for patient's abdominal pain  Lipase to evaluate for pancreatitis  EKG and troponin to evaluate for cardiac source of pain  CT Ab/pelvis without contrast ordered by nursing while patient was in lobby. Discussed limited use of this study given the patient's localization of pain given lack of contrast.  Offered repeat scan with contrast versus expectant management and reassessment with pain level prior to repeat scan.  Patient stated he would prefer to reassess if his pain resolves prior to immediately getting a contrasted CT scan. Urinalysis and repeat physical assessment to evaluate for UTI/Pyelonpehritis  Empiric management of symptoms with escalating pain control and antiemetics as needed.   Initial Study Results:   Laboratory  All laboratory results reviewed without evidence of clinically relevant pathology.      EKG EKG was reviewed independently. Rate, rhythm, axis, intervals all examined and without medically relevant abnormality. ST segments without concerns for elevations.    Radiology All images reviewed independently. Agree with radiology report at this time.   CT Renal Stone Study  Result Date: 09/28/2021 CLINICAL DATA:  Flank pain, kidney stone suspected. Right flank pain since this morning. EXAM: CT ABDOMEN AND PELVIS WITHOUT CONTRAST TECHNIQUE: Multidetector CT imaging of the abdomen and pelvis was performed following the standard protocol without IV contrast. RADIATION DOSE REDUCTION: This exam was performed according to the departmental dose-optimization program which includes automated exposure control,  adjustment of the mA and/or kV according to patient size and/or use of iterative reconstruction technique. COMPARISON:  Abdominopelvic CT 04/04/2019. FINDINGS: Lower chest: Clear lung bases. No significant pleural or pericardial effusion. Hepatobiliary: Diffusely decreased density throughout the liver consistent with steatosis. No focal abnormalities are identified on noncontrast imaging. No evidence of gallstones, gallbladder wall thickening or biliary dilatation. Pancreas: Unremarkable. No pancreatic ductal dilatation or surrounding inflammatory changes. Spleen: Normal in size without focal abnormality. Adrenals/Urinary Tract: Both adrenal glands appear normal. No evidence of urinary tract calculus, suspicious renal lesion or hydronephrosis. 1.8 cm low-density lesion in the lower pole of the left kidney on image 36/2 is suboptimally evaluated without contrast, although appeared to reflect a cyst on previous study done with contrast, and is unchanged. No follow-up imaging recommended. The bladder appears unremarkable for its degree of distention. Stomach/Bowel: No enteric contrast administered. The stomach appears unremarkable for its degree of distension. No evidence of bowel wall thickening, distention or surrounding inflammatory change. The appendix appears normal. Mild distal colonic diverticulosis. Vascular/Lymphatic: Small lymph nodes in the upper abdomen are unchanged. No significant vascular findings on noncontrast imaging. Reproductive: The prostate gland and seminal vesicles appear unremarkable. Other: Stable asymmetric fat in the left inguinal canal. Stable tiny periumbilical hernia containing only fat. No  ascites. Musculoskeletal: No acute or significant osseous findings. Stable scattered benign bone islands. Chronic bilateral L5 pars defects without significant resulting anterolisthesis or foraminal narrowing at L5-S1. Progressive degenerative disc disease with endplate sclerosis at Y6-3. IMPRESSION:  1. No evidence urinary tract calculus or hydronephrosis. No acute abdominal findings are identified. 2. 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 Chronic bilateral L5 pars defects. No acute osseous findings. 3. Hepatic steatosis and distal colonic diverticulosis are noted. Electronically Signed   By: Richardean Sale M.D.   On: 09/28/2021 15:36    Final Reassessment and Plan:   Reassess patient in the emergency department after 4 hours of observation.  His syndrome is grossly resolved.  He has only minor discomfort in his right upper quadrant now.  Had a shared medical decision making with patient regarding next best steps.  I favor that he likely is having a flare of gastritis given his history of similar.  Recommended increasing Protonix to 40 mg twice daily and following up close with primary care provider.  However, given the severity of pain described this morning and history of appendicitis, informed patient that a noncontrasted CT is ultimately not sensitive enough to completely exclude appendicitis.  I offered immediate rescan again at this time with contrast for complete evaluation of this condition.  Patient stated that since his pain is grossly improved, he would feel comfortable with outpatient follow-up with his primary care provider.  Protonix dose increased as described above and close follow-up with primary care provider within 48 hours reinforced.  Strict return precautions regarding recurrence of his right lower quadrant abdominal pain reinforced.  Patient discharged with no further acute events.    Clinical Impression:  1. Generalized abdominal pain      Discharge   Final Clinical Impression(s) / ED Diagnoses Final diagnoses:  Generalized abdominal pain    Rx / DC Orders ED Discharge Orders          Ordered    pantoprazole (PROTONIX) 20 MG tablet  2 times daily        09/28/21 1619              Tretha Sciara, MD 09/28/21 1620

## 2021-10-12 ENCOUNTER — Other Ambulatory Visit: Payer: Self-pay | Admitting: Internal Medicine

## 2021-10-12 DIAGNOSIS — M5416 Radiculopathy, lumbar region: Secondary | ICD-10-CM

## 2021-10-12 DIAGNOSIS — R0789 Other chest pain: Secondary | ICD-10-CM

## 2022-10-08 ENCOUNTER — Other Ambulatory Visit: Payer: Self-pay | Admitting: Internal Medicine

## 2022-10-08 DIAGNOSIS — R972 Elevated prostate specific antigen [PSA]: Secondary | ICD-10-CM

## 2022-10-12 ENCOUNTER — Ambulatory Visit
Admission: RE | Admit: 2022-10-12 | Discharge: 2022-10-12 | Disposition: A | Discharge status: Home / Self Care | Payer: Commercial Managed Care - PPO | Source: Ambulatory Visit | Attending: Internal Medicine | Admitting: Internal Medicine

## 2022-10-12 DIAGNOSIS — R972 Elevated prostate specific antigen [PSA]: Secondary | ICD-10-CM | POA: Diagnosis present

## 2022-10-12 MED ORDER — GADOBUTROL 1 MMOL/ML IV SOLN
10.0000 mL | Freq: Once | INTRAVENOUS | Status: AC | PRN
  Administered 2022-10-12: 10 mL via INTRAVENOUS

## 2022-12-31 ENCOUNTER — Other Ambulatory Visit (HOSPITAL_COMMUNITY): Payer: Self-pay | Admitting: Urology

## 2022-12-31 DIAGNOSIS — C61 Malignant neoplasm of prostate: Secondary | ICD-10-CM

## 2023-01-14 ENCOUNTER — Encounter (HOSPITAL_COMMUNITY)
Admission: RE | Admit: 2023-01-14 | Discharge: 2023-01-14 | Disposition: A | Discharge status: Home / Self Care | Payer: Commercial Managed Care - PPO | Source: Ambulatory Visit | Attending: Urology | Admitting: Urology

## 2023-01-14 DIAGNOSIS — C61 Malignant neoplasm of prostate: Secondary | ICD-10-CM | POA: Insufficient documentation

## 2023-01-14 MED ORDER — FLOTUFOLASTAT F 18 GALLIUM 296-5846 MBQ/ML IV SOLN
8.5000 | Freq: Once | INTRAVENOUS | Status: AC
  Administered 2023-01-14: 8.5 via INTRAVENOUS

## 2023-01-17 ENCOUNTER — Encounter: Payer: Self-pay | Admitting: Radiation Oncology

## 2023-01-17 NOTE — Progress Notes (Addendum)
 GU Location of Tumor / Histology: Prostate Ca  If Prostate Cancer, Gleason Score is (4 + 5) and PSA is (4.50 on 10/01/2022)  Biopsies      01/14/2023 Dr. Lonni Devere NM PET (PSMA) Skull to Mid Thigh CLINICAL DATA:  High-risk prostate cancer. 4+5=9 prostate adenocarcinoma. Staging exam.  IMPRESSION: 1. Intense activity at the LEFT gland apex consistent with primary prostate adenocarcinoma. 2. No evidence of metastatic adenopathy in the pelvis or periaortic retroperitoneum. 3. No evidence of visceral metastasis or skeletal metastasis. 4. Mild hepatic steatosis.   10/12/2022 Dr. Oneil Pinal MR Prostate with/without Contrast CLINICAL DATA: Elevated PSA of 4.5 on 10/01/2022. R97.20   IMPRESSION: 1. PI-RADS category 4 lesion of the left posteromedial peripheral zone at the apex. Targeting data sent to [facility]. 2. Mild prostatomegaly.  Past/Anticipated interventions by urology, if any: NA  Past/Anticipated interventions by medical oncology, if any: NA  Weight changes, if any: No  IPSS:  10 SHIM:  16  Bowel/Bladder complaints, if any:  Weak urine stream  Nausea/Vomiting, if any:  No  Pain issues, if any:  0/10  SAFETY ISSUES: Prior radiation? No Pacemaker/ICD? No Possible current pregnancy? Male Is the patient on methotrexate? No  Current Complaints / other details:  Few months ago had some pain in right testicle felt like a pulled muscle.  Wanted someone to know about this in case scans are needed.

## 2023-01-23 NOTE — Progress Notes (Signed)
 Radiation Oncology         [phone] ________________________________  Initial Outpatient Consultation  Name: [name] MRN: [id number]  Date: 01/24/2023  DOB: [birth year]  RR:Fpoozm, Oneil FALCON, MD  Devere Lonni Fire*   REFERRING PHYSICIAN: Devere Lonni Fire*  DIAGNOSIS: 58 y.o. gentleman with Stage T1c adenocarcinoma of the prostate with Gleason score of 4+5, and PSA of 4.5.    ICD-10-CM   1. Malignant neoplasm of prostate (HCC)  C61       HISTORY OF PRESENT ILLNESS: Jason Price is a 58 y.o. male with a diagnosis of prostate cancer. He was noted to have an elevated PSA of 4.5 by his primary care physician, Dr. Cleotilde. He underwent prostate MRI on 10/12/22 showing a PI-RADS 4 lesion of the left posteromedial peripheral zone at the apex. Accordingly, he was referred for evaluation in urology by Dr. Devere on 11/18/22. The patient proceeded to MRI fusion biopsy of the prostate on 12/24/22.  The prostate volume measured 51 cc.  Out of 17 core biopsies, 6 were positive.  The maximum Gleason score was 4+5, and this was seen in 4 of 5 cores from the MRI ROI (with perineural invasion), as well as in the left mid, and left apex.  He underwent a staging PSMA PET scan on 01/14/23 showing no evidence of disease outside of the prostate.  The patient reviewed the biopsy and imaging results with his urologist and he has kindly been referred today for discussion of potential radiation treatment options.   PREVIOUS RADIATION THERAPY: No  PAST MEDICAL HISTORY:  Past Medical History:  Diagnosis Date   Chronic insomnia    Elevated PSA    Gastritis    GERD (gastroesophageal reflux disease)    Heartburn    Hiatal hernia    HYPERLIPIDEMIA-MIXED    HYPERSOMNIA, ASSOCIATED WITH SLEEP APNEA    IBS (irritable bowel syndrome)       PAST SURGICAL HISTORY: Past Surgical History:  Procedure Laterality Date   Athroscopic knee surgery     LAPAROSCOPIC APPENDECTOMY N/A 09/12/2018    Procedure: APPENDECTOMY LAPAROSCOPIC;  Surgeon: Vernetta Berg, MD;  Location: [facility];  Service: General;  Laterality: N/A;   PROSTATE BIOPSY      FAMILY HISTORY:  Family History  Problem Relation Age of Onset   CAD Father        MI at age 56   Alcoholism Father    Heart attack Father    Diabetes Father    Hypertension Mother    Insomnia Mother    Breast cancer Maternal Aunt     SOCIAL HISTORY:  Social History   Socioeconomic History   Marital status: Married    Spouse name: Clotilda   Number of children: 1   Years of education: 12   Highest education level: Not on file  Occupational History   Not on file  Tobacco Use   Smoking status: Every Day    Current packs/day: 0.50    Average packs/day: 0.5 packs/day for 25.0 years (12.5 ttl pk-yrs)    Types: Cigarettes   Smokeless tobacco: Former    Types: Snuff  Vaping Use   Vaping status: Never Used  Substance and Sexual Activity   Alcohol use: Yes    Alcohol/week: 18.0 standard drinks of alcohol    Types: 18 Cans of beer per week   Drug use: No   Sexual activity: Not on file  Other Topics Concern   Not on file  Social History  Narrative   Patient is married Paramedic).   Patient has one child.   Patient does not drink any caffeine.   Patient is a production designer, theatre/television/film.   Patient has a high school education.   Patient is right-handed.            Social Drivers of Corporate Investment Banker Strain: Low Risk  (06/15/2022)   Received from [facility]   Overall Financial Resource Strain (CARDIA)    Difficulty of Paying Living Expenses: Not hard at all  Food Insecurity: No Food Insecurity (01/24/2023)   Hunger Vital Sign    Worried About Running Out of Food in the Last Year: Never true    Ran Out of Food in the Last Year: Never true  Transportation Needs: No Transportation Needs (01/24/2023)   PRAPARE - Administrator, Civil Service (Medical): No    Lack of Transportation (Non-Medical): No   Physical Activity: Not on file  Stress: Not on file  Social Connections: Not on file  Intimate Partner Violence: Not At Risk (01/24/2023)   Humiliation, Afraid, Rape, and Kick questionnaire    Fear of Current or Ex-Partner: No    Emotionally Abused: No    Physically Abused: No    Sexually Abused: No    ALLERGIES: Niacin, Niaspan [niacin er (antihyperlipidemic)], and Simvastatin  MEDICATIONS:  Current Outpatient Medications  Medication Sig Dispense Refill   gabapentin  (NEURONTIN ) 300 MG capsule Take 600 mg by mouth at bedtime.     pantoprazole  (PROTONIX ) 40 MG tablet Take 40 mg by mouth 2 (two) times daily.     No current facility-administered medications for this encounter.    REVIEW OF SYSTEMS:  On review of systems, the patient reports that he is doing well overall. He denies any chest pain, shortness of breath, cough, fevers, chills, night sweats, or unintended weight changes. He reports persistent right testicular discomfort that started following a tennis match approximately 5-6 weeks ago. He denies any visible swelling, erythema or palpable mass. He denies any bowel disturbances, and denies abdominal pain, nausea or vomiting. He denies any new musculoskeletal or joint aches or pains. His IPSS was 10, indicating mild-moderate urinary symptoms. His SHIM was 16, indicating he has mild-moderate erectile dysfunction. A complete review of systems is obtained and is otherwise negative.    PHYSICAL EXAM:  Wt Readings from Last 3 Encounters:  01/24/23 227 lb 9.6 oz (103.2 kg)  01/24/23 227 lb 9.6 oz (103.2 kg)  09/12/18 220 lb (99.8 kg)   Temp Readings from Last 3 Encounters:  01/24/23 97.9 F (36.6 C) (Oral)  01/24/23 97.9 F (36.6 C) (Oral)  09/28/21 98.5 F (36.9 C) (Oral)   BP Readings from Last 3 Encounters:  01/24/23 (!) 159/98  01/24/23 (!) 159/98  09/28/21 (!) 147/108   Pulse Readings from Last 3 Encounters:  01/24/23 85  01/24/23 85  09/28/21 80   Pain  Assessment Pain Score: 0-No pain/10  In general this is a well appearing Caucasian male in no acute distress. He's alert and oriented x4 and appropriate throughout the examination. Cardiopulmonary assessment is negative for acute distress, and he exhibits normal effort.     KPS = 100  100 - Normal; no complaints; no evidence of disease. 90   - Able to carry on normal activity; minor signs or symptoms of disease. 80   - Normal activity with effort; some signs or symptoms of disease. 44   - Cares for self; unable to carry  on normal activity or to do active work. 60   - Requires occasional assistance, but is able to care for most of his personal needs. 50   - Requires considerable assistance and frequent medical care. 40   - Disabled; requires special care and assistance. 30   - Severely disabled; hospital admission is indicated although death not imminent. 20   - Very sick; hospital admission necessary; active supportive treatment necessary. 10   - Moribund; fatal processes progressing rapidly. 0     - Dead  Karnofsky DA, Abelmann WH, Craver LS and Burchenal [facility] [phone]) The use of the nitrogen mustards in the palliative treatment of carcinoma: with particular reference to bronchogenic carcinoma Cancer 1 634-56  LABORATORY DATA:  Lab Results  Component Value Date   WBC 8.8 09/28/2021   HGB 15.8 09/28/2021   HCT 45.6 09/28/2021   MCV 88.2 09/28/2021   PLT 191 09/28/2021   Lab Results  Component Value Date   NA 140 09/28/2021   K 4.4 09/28/2021   CL 106 09/28/2021   CO2 24 09/28/2021   Lab Results  Component Value Date   ALT 38 09/28/2021   AST 26 09/28/2021   ALKPHOS 67 09/28/2021   BILITOT 0.6 09/28/2021     RADIOGRAPHY: NM PET (PSMA) SKULL TO MID THIGH Result Date: 01/18/2023 CLINICAL DATA:  High-risk prostate cancer. 4+5=9 prostate adenocarcinoma. Staging exam. EXAM: NUCLEAR MEDICINE PET SKULL BASE TO THIGH TECHNIQUE: 8.5 mCi Flotufolastat (Posluma ) was injected  intravenously. Full-ring PET imaging was performed from the skull base to thigh after the radiotracer. CT data was obtained and used for attenuation correction and anatomic localization. COMPARISON:  Prostate MRI 10/12/2022 FINDINGS: NECK No radiotracer activity in neck lymph nodes. Incidental CT finding: None. CHEST No radiotracer accumulation within mediastinal or hilar lymph nodes. No suspicious pulmonary nodules on the CT scan. Incidental CT finding: None. ABDOMEN/PELVIS Prostate: Intense activity at the LEFT gland apex with SUV max equal 17. This corresponds to PI-RADS 4 lesion on comparison MRI. Lymph nodes: No abnormal radiotracer accumulation within pelvic or abdominal nodes. Liver: No evidence of liver metastasis. Incidental CT finding: Low-attenuation liver suggests hepatic steatosis. SKELETON No focal activity to suggest skeletal metastasis. IMPRESSION: 1. Intense activity at the LEFT gland apex consistent with primary prostate adenocarcinoma. 2. No evidence of metastatic adenopathy in the pelvis or periaortic retroperitoneum. 3. No evidence of visceral metastasis or skeletal metastasis. 4. Mild hepatic steatosis. Electronically Signed   By: Jackquline Boxer M.D.   On: 01/18/2023 13:47      IMPRESSION/PLAN: 1. 58 y.o. gentleman with Stage T1c adenocarcinoma of the prostate with Gleason Score of 4+5, and PSA of 4.5. We discussed the patient's workup and outlined the nature of prostate cancer in this setting. The patient's T stage, Gleason's score, and PSA put him into the high risk group. Accordingly, he is eligible for a variety of potential treatment options including prostatectomy or LT-ADT concurrent with either 8 weeks of external radiation or 5 weeks of external radiation with an upfront brachytherapy boost. We discussed the available radiation techniques, and focused on the details and logistics of delivery. We discussed and outlined the risks, benefits, short and long-term effects associated  with radiotherapy and compared and contrasted these with prostatectomy. We discussed the role of SpaceOAR gel in reducing the rectal toxicity associated with radiotherapy. We also detailed the role of ADT in the treatment of high risk prostate cancer and outlined the associated side effects that could be expected with this  therapy.   The patient focused most of his questions and interest in robotic-assisted laparoscopic radical prostatectomy.  We discussed some of the potential advantages of surgery including surgical staging, the availability of salvage radiotherapy to the prostatic fossa, and the confidence associated with immediate biochemical response.  We discussed some of the potential proven indications for postoperative radiotherapy including positive margins, extracapsular extension, and seminal vesicle involvement. We also talked about some of the other potential findings leading to a recommendation for radiotherapy including a non-zero postoperative PSA and positive lymph nodes. He appears to have a good understanding of his disease and our treatment recommendations which are of curative intent.  He was encouraged to ask questions that were answered to his stated satisfaction.  At the conclusion of our conversation, the patient is eager to meet with Dr. Renda for consult to further discuss his surgical options prior to making a final decision regarding his treatment preference.  He is tentatively scheduled for a consult visit with Dr. Renda on 02/15/2023.  He has our contact information and will let us  know if he ultimately elects to proceed with radiotherapy and/or has further questions or concerns regarding our conversation today.  We enjoyed meeting him and his wife and look forward to continuing to participate in his care.  We personally spent 70 minutes in this encounter including chart review, reviewing radiological studies, meeting face-to-face with the patient, entering orders and  completing documentation.    Sabra MICAEL Rusk, PA-C    Donnice Barge, MD  [facility]  Radiation Oncology Direct Dial: [phone]  Fax: [phone] [email or web address]  Skype  LinkedIn   This document serves as a record of services personally performed by Donnice Barge, MD and Sabra Rusk, PA-C. It was created on their behalf by Izetta Neither, a trained medical scribe. The creation of this record is based on the scribe's personal observations and the provider's statements to them. This document has been checked and approved by the attending provider.

## 2023-01-24 ENCOUNTER — Ambulatory Visit
Admission: RE | Admit: 2023-01-24 | Discharge: 2023-01-24 | Disposition: A | Discharge status: Home / Self Care | Payer: Commercial Managed Care - PPO | Source: Ambulatory Visit | Attending: Radiation Oncology | Admitting: Radiation Oncology

## 2023-01-24 ENCOUNTER — Encounter: Payer: Self-pay | Admitting: Radiation Oncology

## 2023-01-24 VITALS — BP 159/98 | HR 85 | Temp 97.9°F | Resp 17 | Wt 227.6 lb

## 2023-01-24 DIAGNOSIS — G4700 Insomnia, unspecified: Secondary | ICD-10-CM | POA: Insufficient documentation

## 2023-01-24 DIAGNOSIS — E785 Hyperlipidemia, unspecified: Secondary | ICD-10-CM | POA: Diagnosis not present

## 2023-01-24 DIAGNOSIS — Z803 Family history of malignant neoplasm of breast: Secondary | ICD-10-CM | POA: Diagnosis not present

## 2023-01-24 DIAGNOSIS — K449 Diaphragmatic hernia without obstruction or gangrene: Secondary | ICD-10-CM | POA: Diagnosis not present

## 2023-01-24 DIAGNOSIS — K589 Irritable bowel syndrome without diarrhea: Secondary | ICD-10-CM | POA: Diagnosis not present

## 2023-01-24 DIAGNOSIS — C61 Malignant neoplasm of prostate: Secondary | ICD-10-CM | POA: Diagnosis present

## 2023-01-24 DIAGNOSIS — M129 Arthropathy, unspecified: Secondary | ICD-10-CM | POA: Insufficient documentation

## 2023-01-24 DIAGNOSIS — Z79899 Other long term (current) drug therapy: Secondary | ICD-10-CM | POA: Diagnosis not present

## 2023-01-24 DIAGNOSIS — K760 Fatty (change of) liver, not elsewhere classified: Secondary | ICD-10-CM | POA: Diagnosis not present

## 2023-01-24 DIAGNOSIS — F17210 Nicotine dependence, cigarettes, uncomplicated: Secondary | ICD-10-CM | POA: Diagnosis not present

## 2023-01-24 DIAGNOSIS — K219 Gastro-esophageal reflux disease without esophagitis: Secondary | ICD-10-CM | POA: Diagnosis not present

## 2023-01-24 HISTORY — DX: Elevated prostate specific antigen (PSA): R97.20

## 2023-01-24 NOTE — Progress Notes (Signed)
 Introduced myself to the patient, and his wife, as the prostate nurse navigator.  No barriers to care identified at this time.  He is here to discuss his radiation treatment options and will also have a surgical consult with Dr. Renda on 2/4.  I gave him my business card and asked him to call me with questions or concerns.  Verbalized understanding.

## 2023-02-15 NOTE — Progress Notes (Signed)
Patient will proceed with RALP for his stage T1c adenocarcinoma of the prostate with Gleason score of 4+5, and PSA of 4.5.

## 2023-02-21 ENCOUNTER — Other Ambulatory Visit: Payer: Self-pay | Admitting: Urology

## 2023-02-21 NOTE — Progress Notes (Addendum)
COVID Vaccine received:  []  No [x]  Yes Date of any COVID positive Test in last 90 days: None  PCP - Bethann Punches, MD at [facility] clinic Cardiologist -  none  Chest x-ray -06-15-2022  2v  in [facility] EKG - 09-30-2021  Epic   will repeat   Stress Test -  ECHO -  Cardiac Cath -   PCR screen: []  Ordered & Completed []   No Order but Needs PROFEND     [x]   N/A for this surgery  Surgery Plan:  []  Ambulatory   [x]  Outpatient in bed  []  Admit Anesthesia:    [x]  General  []  Spinal  []   Choice []   MAC  Bowel Prep - []  No  [x]   Yes _mag citrate, Fleet Enema_____  Pacemaker / ICD device [x]  No []  Yes   Spinal Cord Stimulator:[x]  No []  Yes       History of Sleep Apnea? []  No [x]  Yes   CPAP used?- []  No [x]  Yes    Does the patient monitor blood sugar?   []  N/A   [x]  No []  Yes  Patient has: []  NO Hx DM   [x]  Pre-DM   []  DM1  []   DM2 Last A1c was: 5.7 on 05-31-2022      Blood Thinner / Instructions: none Aspirin Instructions:  none  ERAS Protocol Ordered: [x]  No  []  Yes Patient is to be NPO after: midnight prior  Dental hx: []  Dentures:  [x]  N/A      []  Bridge or Partial:                   []  Loose or Damaged teeth:   Activity level: Patient is able to climb a flight of stairs without difficulty; [x]  No CP  [x]  No SOB.  Patient can perform ADLs without assistance.   Anesthesia review: OSA-CPAP, GERD, HTN, PRE-DM, ETOH, smokes  Patient denies shortness of breath, fever, cough and chest pain at PAT appointment.  Patient verbalized understanding and agreement to the Pre-Surgical Instructions that were given to them at this PAT appointment. Patient was also educated of the need to review these PAT instructions again prior to his surgery.I reviewed the appropriate phone numbers to call if they have any and questions or concerns.

## 2023-02-21 NOTE — Patient Instructions (Addendum)
SURGICAL WAITING ROOM VISITATION Patients having surgery or a procedure may have no more than 2 support people in the waiting area - these visitors may rotate in the visitor waiting room.   Due to an increase in RSV and influenza rates and associated hospitalizations, children ages 30 and under may not visit patients in [facility] hospitals. If the patient needs to stay at the hospital during part of their recovery, the visitor guidelines for inpatient rooms apply.  PRE-OP VISITATION  Pre-op nurse will coordinate an appropriate time for 1 support person to accompany the patient in pre-op.  This support person may not rotate.  This visitor will be contacted when the time is appropriate for the visitor to come back in the pre-op area.  Please refer to the [facility] website for the visitor guidelines for Inpatients (after your surgery is over and you are in a regular room).  You are not required to quarantine at this time prior to your surgery. However, you must do this: Hand Hygiene often Do NOT share personal items Notify your provider if you are in close contact with someone who has COVID or you develop fever 100.4 or greater, new onset of sneezing, cough, sore throat, shortness of breath or body aches.  If you test positive for Covid or have been in contact with anyone that has tested positive in the last 10 days please notify you surgeon.    Your procedure is scheduled on:  Thursday, March 03, 2023  Report to [facility] Main Entrance: [address] [facility] entrance where the [employer] is available.   Report to admitting at:  09:00   AM  Call this number if you have any questions or problems the morning of surgery [phone]  DO NOT EAT OR DRINK ANYTHING AFTER MIDNIGHT THE NIGHT PRIOR TO YOUR SURGERY / PROCEDURE.   FOLLOW  ANY ADDITIONAL PRE OP INSTRUCTIONS YOU RECEIVED FROM YOUR SURGEON'S OFFICE!!!  MAGNESIUM CITRATE:  Obtain one (1)  bottle (10 oz) of Magnesium Citrate at  your pharmacy. Drink entire bottle at 12:00 noon the day before your surgery/ procedure.  FLEET ENEMA: Obtain one(1) Fleet Enema (sodium phosphate 7-19 gm / 118 ml enema) and use (according to the directions on the box) the night prior to your surgery.   If you have any questions, please contact your Surgeon's office for additional information.    Oral Hygiene is also important to reduce your risk of infection.        Remember - BRUSH YOUR TEETH THE MORNING OF SURGERY WITH YOUR REGULAR TOOTHPASTE  Do NOT smoke after Midnight the night before surgery.  STOP TAKING all Vitamins, Herbs and supplements 1 week before your surgery.   Take ONLY these medicines the morning of surgery with A SIP OF WATER: Pantoprazole   If You have been diagnosed with Sleep Apnea - Bring CPAP mask and tubing day of surgery. We will provide you with a CPAP machine on the day of your surgery.                   You may not have any metal on your body including  jewelry, and body piercing  Do not wear  lotions, powders,  cologne, or deodorant  Men may shave face and neck.  Contacts, Hearing Aids, dentures or bridgework may not be worn into surgery. DENTURES WILL BE REMOVED PRIOR TO SURGERY PLEASE DO NOT APPLY "Poly grip" OR ADHESIVES!!!  You may bring a small overnight bag with  you on the day of surgery, only pack items that are not valuable. [facility] IS NOT RESPONSIBLE   FOR VALUABLES THAT ARE LOST OR STOLEN.   Do not bring your home medications to the hospital. The Pharmacy will dispense medications listed on your medication list to you during your admission in the Hospital.  Special Instructions: Bring a copy of your healthcare power of attorney and living will documents the day of surgery, if you wish to have them scanned into your [facility] Records- EPIC  Please read over the following fact sheets you were given: IF YOU HAVE QUESTIONS ABOUT YOUR PRE-OP INSTRUCTIONS, PLEASE CALL  [phone].   [facility] - Preparing for Surgery Before surgery, you can play an important role.  Because skin is not sterile, your skin needs to be as free of germs as possible.  You can reduce the number of germs on your skin by washing with CHG (chlorahexidine gluconate) soap before surgery.  CHG is an antiseptic cleaner which kills germs and bonds with the skin to continue killing germs even after washing. Please DO NOT use if you have an allergy to CHG or antibacterial soaps.  If your skin becomes reddened/irritated stop using the CHG and inform your nurse when you arrive at Short Stay. Do not shave (including legs and underarms) for at least 48 hours prior to the first CHG shower.  You may shave your face/neck.  Please follow these instructions carefully:  1.  Shower with CHG Soap the night before surgery and the  morning of surgery.  2.  If you choose to wash your hair, wash your hair first as usual with your normal  shampoo.  3.  After you shampoo, rinse your hair and body thoroughly to remove the shampoo.                             4.  Use CHG as you would any other liquid soap.  You can apply chg directly to the skin and wash.  Gently with a scrungie or clean washcloth.  5.  Apply the CHG Soap to your body ONLY FROM THE NECK DOWN.   Do not use on face/ open                           Wound or open sores. Avoid contact with eyes, ears mouth and genitals (private parts).                       Wash face,  Genitals (private parts) with your normal soap.             6.  Wash thoroughly, paying special attention to the area where your  surgery  will be performed.  7.  Thoroughly rinse your body with warm water from the neck down.  8.  DO NOT shower/wash with your normal soap after using and rinsing off the CHG Soap.            9.  Pat yourself dry with a clean towel.            10.  Wear clean pajamas.            11.  Place clean sheets on your bed the night of your first shower and do  not  sleep with pets.  ON THE DAY OF SURGERY : Do not apply any lotions/deodorants  the morning of surgery.  Please wear clean clothes to the hospital/surgery center.    FAILURE TO FOLLOW THESE INSTRUCTIONS MAY RESULT IN THE CANCELLATION OF YOUR SURGERY  PATIENT SIGNATURE_________________________________  NURSE SIGNATURE__________________________________  ________________________________________________________________________

## 2023-02-23 ENCOUNTER — Encounter (HOSPITAL_COMMUNITY): Payer: Self-pay

## 2023-02-23 ENCOUNTER — Encounter (HOSPITAL_COMMUNITY)
Admission: RE | Admit: 2023-02-23 | Discharge: 2023-02-23 | Disposition: A | Discharge status: Home / Self Care | Payer: Commercial Managed Care - PPO | Source: Ambulatory Visit | Attending: Urology

## 2023-02-23 ENCOUNTER — Other Ambulatory Visit: Payer: Self-pay

## 2023-02-23 VITALS — BP 148/98 | HR 70 | Temp 98.3°F | Resp 16 | Ht 68.0 in | Wt 224.0 lb

## 2023-02-23 DIAGNOSIS — Z01812 Encounter for preprocedural laboratory examination: Secondary | ICD-10-CM | POA: Diagnosis present

## 2023-02-23 DIAGNOSIS — I10 Essential (primary) hypertension: Secondary | ICD-10-CM | POA: Insufficient documentation

## 2023-02-23 DIAGNOSIS — Z789 Other specified health status: Secondary | ICD-10-CM | POA: Diagnosis not present

## 2023-02-23 DIAGNOSIS — K760 Fatty (change of) liver, not elsewhere classified: Secondary | ICD-10-CM | POA: Insufficient documentation

## 2023-02-23 DIAGNOSIS — Z01810 Encounter for preprocedural cardiovascular examination: Secondary | ICD-10-CM | POA: Diagnosis present

## 2023-02-23 DIAGNOSIS — Z01818 Encounter for other preprocedural examination: Secondary | ICD-10-CM | POA: Insufficient documentation

## 2023-02-23 HISTORY — DX: Essential (primary) hypertension: I10

## 2023-02-23 HISTORY — DX: Personal history of other diseases of the digestive system: Z87.19

## 2023-02-23 HISTORY — DX: Myoneural disorder, unspecified: G70.9

## 2023-02-23 HISTORY — DX: Sleep apnea, unspecified: G47.30

## 2023-02-23 HISTORY — DX: Malignant (primary) neoplasm, unspecified: C80.1

## 2023-02-23 HISTORY — DX: Prediabetes: R73.03

## 2023-02-23 HISTORY — DX: Nicotine dependence, unspecified, uncomplicated: F17.200

## 2023-02-23 LAB — CBC
HCT: 45.9 % (ref 39.0–52.0)
Hemoglobin: 15.4 g/dL (ref 13.0–17.0)
MCH: 30.9 pg (ref 26.0–34.0)
MCHC: 33.6 g/dL (ref 30.0–36.0)
MCV: 92.2 fL (ref 80.0–100.0)
Platelets: 181 10*3/uL (ref 150–400)
RBC: 4.98 MIL/uL (ref 4.22–5.81)
RDW: 12.5 % (ref 11.5–15.5)
WBC: 7.7 10*3/uL (ref 4.0–10.5)
nRBC: 0 % (ref 0.0–0.2)

## 2023-02-23 LAB — COMPREHENSIVE METABOLIC PANEL
ALT: 46 U/L — ABNORMAL HIGH (ref 0–44)
AST: 27 U/L (ref 15–41)
Albumin: 4.1 g/dL (ref 3.5–5.0)
Alkaline Phosphatase: 65 U/L (ref 38–126)
Anion gap: 10 (ref 5–15)
BUN: 19 mg/dL (ref 6–20)
CO2: 23 mmol/L (ref 22–32)
Calcium: 9.4 mg/dL (ref 8.9–10.3)
Chloride: 104 mmol/L (ref 98–111)
Creatinine, Ser: 1.01 mg/dL (ref 0.61–1.24)
GFR, Estimated: >60 mL/min (ref >60)
Glucose, Bld: 101 mg/dL — ABNORMAL HIGH (ref 70–99)
Potassium: 4.1 mmol/L (ref 3.5–5.1)
Sodium: 137 mmol/L (ref 135–145)
Total Bilirubin: 1 mg/dL (ref 0.0–1.2)
Total Protein: 7.4 g/dL (ref 6.5–8.1)

## 2023-03-02 NOTE — H&P (Signed)
Office Visit Report     02/15/2023   --------------------------------------------------------------------------------   Jason Price  MRN: [id number]  DOB: [birth year], 58 year old Male  SSN: [id number]   PRIMARY CARE:     REFERRING:  Bethann Punches, MD  PROVIDER:  Rhoderick Moody, M.D.  TREATING:  Heloise Purpura, M.D.  LOCATION:  [facility] [phone]     --------------------------------------------------------------------------------   CC/HPI: CC: Prostate Cancer   Physician requesting consult: Dr. Devoria Glassing  PCP: Dr. Bethann Punches   Jason Price is a 58 year old gentleman who was found to have an elevated PSA of 4.5 prompting a prostate MRI on 10/13/22 that demonstrated a 1 cm PI-RADS 4 lesion of the left apical peripheral zone. An MR/US fusion biopsy by Dr. 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 He has been counseled by Dr. Liliane Shi and also Dr. Kathrynn Running.   Family history: None.   Imaging studies:  MRI (10/13/22): No EPE, SVI, LAD or bone lesions.  PSMA PET scan (01/14/23): Uptake at left apex consistent with MRI findings. No metastatic disease.   PMH: He has a history of GERD, hyperlipidemia, and sleep apnea.  PSH: He did develop appendicitis approximately 5 years ago and underwent an attempted laparoscopic appendectomy by Dr. Carman Ching. This apparently was not feasible and his appendix was not removed. He subsequently was monitored and his appendicitis resolved with infection and he did not require additional surgery.   TNM stage: cT1c N0 M0  PSA: 4.5  Gleason score: 4+5=9 (GG 5)  Biopsy (12/24/22): 5/17 cores positive  Left: L mid (60%, 4+5=9), left apex (50%, 4+5=9)  Right: Benign  ROI: 4/5 cores positive (80%, 70%, 50%, 40%, PNI - 4+5=9)  Prostate volume: 51.0 cc   Nomogram  OC disease: 37%  EPE: 59%  SVI: 11%  LNI: 12%  PFS (5 year, 10 year):  56%, 39%   Urinary function: IPSS is 15.  Erectile function: SHIM score is 17. He estimates that he can get and keep an erection adequate for intercourse approximately 50% of the time. He has not previously tried PDE 5 inhibitors.     ALLERGIES: Simvastatin    MEDICATIONS: Levofloxacin 750 mg tablet 1 tablet PO As Directed Take one hour prior to your scheduled procedure.  Gabapentin  Pantoprazole Sodium     GU PSH: Prostate Needle Biopsy - 12/24/2022       PSH Notes: Encounter for contraceptive planning, Knee Surgery, attempted to remove appendix was unable   NON-GU PSH: Surgical Pathology, Gross And Microscopic Examination For Prostate Needle - 12/24/2022     GU PMH: Elevated PSA - 12/24/2022, - 11/18/2022      PMH Notes:  2007-10-06 11:39:07 - Note: Normal Routine History And Physical Adult   NON-GU PMH: Personal history of other diseases of the digestive system, History of esophageal reflux - 2014 Personal history of other endocrine, nutritional and metabolic disease, History of hypercholesterolemia - 2014 GERD Sleep Apnea    FAMILY HISTORY: 1 son - Son Death In The Family Father - Runs In Family Family Health Status Number - Runs In Family   SOCIAL HISTORY: Marital Status: Married Preferred Language: English; Ethnicity: Not Hispanic Or Latino; Race: White Current Smoking Status: Patient smokes.   Tobacco Use Assessment Completed: Used Tobacco in last 30 days? Light Drinker.  Does not drink caffeine.     Notes: Occupation:, Alcohol Use,  Marital History - Currently Married, Caffeine Use, Tobacco Use   REVIEW OF SYSTEMS:    GU Review Male:   Patient denies frequent urination, hard to postpone urination, burning/ pain with urination, get up at night to urinate, leakage of urine, stream starts and stops, trouble starting your streams, and have to strain to urinate .  Gastrointestinal (Lower):   Patient denies diarrhea and constipation.  Gastrointestinal (Upper):    Patient denies nausea and vomiting.  Constitutional:   Patient denies fever, night sweats, weight loss, and fatigue.  Skin:   Patient denies skin rash/ lesion and itching.  Eyes:   Patient denies blurred vision and double vision.  Ears/ Nose/ Throat:   Patient denies sore throat and sinus problems.  Hematologic/Lymphatic:   Patient denies swollen glands and easy bruising.  Cardiovascular:   Patient denies leg swelling and chest pains.  Respiratory:   Patient denies cough and shortness of breath.  Endocrine:   Patient denies excessive thirst.  Musculoskeletal:   Patient denies back pain and joint pain.  Neurological:   Patient denies headaches and dizziness.  Psychologic:   Patient denies depression and anxiety.   VITAL SIGNS:      02/15/2023 08:47 AM  Weight 220 lb / 99.79 kg  Height 68 in / 172.72 cm  BP 125/78 mmHg  Pulse 67 /min  Temperature 97.1 F / 36.1 C  BMI 33.4 kg/m   GU PHYSICAL EXAMINATION:    Prostate: Prostate about 40 grams. There is some mild induration toward the left mid/apical gland consistent with his imaging findings and biopsy findings. No definite extra prostatic disease.   MULTI-SYSTEM PHYSICAL EXAMINATION:    Constitutional: Well-nourished. No physical deformities. Normally developed. Good grooming.  Respiratory: No labored breathing, no use of accessory muscles. Clear bilaterally.  Cardiovascular: Normal temperature, normal extremity pulses, no swelling, no varicosities. Regular rate and rhythm.  Gastrointestinal: Mildly obese. Small well-healed prior laparoscopic scars noted.     Complexity of Data:  Lab Test Review:   PSA  Records Review:   Pathology Reports, Previous Patient Records  X-Ray Review: PET- PSMA Scan: Reviewed Films.  MRI Prostate GSORAD: Reviewed Films.     10/01/22  PSA  Total PSA 4.5 ng/ml   Notes:                     CLINICAL DATA: High-risk prostate cancer. 4+5=9 prostate  adenocarcinoma. Staging exam.   EXAM:  NUCLEAR  MEDICINE PET SKULL BASE TO THIGH   TECHNIQUE:  8.5 mCi Flotufolastat (Posluma) was injected intravenously.  Full-ring PET imaging was performed from the skull base to thigh  after the radiotracer. CT data was obtained and used for attenuation  correction and anatomic localization.   COMPARISON: Prostate MRI 10/12/2022   FINDINGS:  NECK   No radiotracer activity in neck lymph nodes.   Incidental CT finding: None.   CHEST   No radiotracer accumulation within mediastinal or hilar lymph nodes.  No suspicious pulmonary nodules on the CT scan.   Incidental CT finding: None.   ABDOMEN/PELVIS   Prostate: Intense activity at the LEFT gland apex with SUV max equal  17. This corresponds to PI-RADS 4 lesion on comparison MRI.   Lymph nodes: No abnormal radiotracer accumulation within pelvic or  abdominal nodes.   Liver: No evidence of liver metastasis.   Incidental CT finding: Low-attenuation liver suggests hepatic  steatosis.   SKELETON   No focal activity to suggest skeletal metastasis.   IMPRESSION:  1. Intense activity at the LEFT gland apex consistent with primary  prostate adenocarcinoma.  2. No evidence of metastatic adenopathy in the pelvis or periaortic  retroperitoneum.  3. No evidence of visceral metastasis or skeletal metastasis.  4. Mild hepatic steatosis.    Electronically Signed  By: Genevive Bi M.D.  On: 01/18/2023 13:47   PROCEDURES:          Urinalysis - 81003 Dipstick Dipstick Cont'd  Color: Yellow Bilirubin: Neg mg/dL  Appearance: Clear Ketones: Neg mg/dL  Specific Gravity: 6.045 Blood: Neg ery/uL  pH: 5.5 Protein: Trace mg/dL  Glucose: Neg mg/dL Urobilinogen: 0.2 mg/dL    Nitrites: Neg    Leukocyte Esterase: Neg leu/uL    ASSESSMENT:      ICD-10 Details  1 GU:   Prostate Cancer - C61    PLAN:           Schedule Return Visit/Planned Activity: Other See Visit Notes             Note: Will call to schedule surgery.  Return  Visit/Planned Activity: Next Available Appointment - PT Referral             Note: Please schedule patient for preoperative PT prior to radical prostatectomy.            Document Letter(s):  Created for Patient: Clinical Summary         Notes:   1. High risk prostate cancer: I had a detailed discussion with Jason Price and his wife today. The patient was counseled about the natural history of prostate cancer and the standard treatment options that are available for prostate cancer. It was explained to him how his age and life expectancy, clinical stage, Gleason score/prognostic grade group, and PSA (and PSA density) affect his prognosis, the decision to proceed with additional staging studies, as well as how that information influences recommended treatment strategies. We discussed the roles for active surveillance, radiation therapy, surgical therapy, androgen deprivation, as well as ablative therapy and other investigational options for the treatment of prostate cancer as appropriate to his individual cancer situation. We discussed the risks and benefits of these options with regard to their impact on cancer control and also in terms of potential adverse events, complications, and impact on quality of life particularly related to urinary and sexual function. The patient was encouraged to ask questions throughout the discussion today and all questions were answered to his stated satisfaction. In addition, the patient was provided with and/or directed to appropriate resources and literature for further education about prostate cancer and treatment options. We discussed surgical therapy for prostate cancer including the different available surgical approaches. We discussed, in detail, the risks and expectations of surgery with regard to cancer control, urinary control, and erectile function as well as the expected postoperative recovery process. Additional risks of surgery including but not limited to  bleeding, infection, hernia formation, nerve damage, lymphocele formation, bowel/rectal injury potentially necessitating colostomy, damage to the urinary tract resulting in urine leakage, urethral stricture, and the cardiopulmonary risks such as myocardial infarction, stroke, death, venothromboembolism, etc. were explained. The risk of open surgical conversion for robotic/laparoscopic prostatectomy was also discussed.   He does adamantly wish to proceed with surgical therapy and will be scheduled for a bilateral nerve sparing (partial on the left) robot-assisted laparoscopic radical prostatectomy and bilateral pelvic lymphadenectomy.   CC: Dr. Bethann Punches  Dr. Rhoderick Moody  Dr. Margaretmary Dys    E & M CODES: We spent 58 minutes  dedicated to evaluation and management time, including face to face interaction, discussions on coordination of care, documentation, result review, and discussion with others as applicable.     * Signed by Heloise Purpura, M.D. on 02/15/23 at 9:51 PM (EST)*     APPENDED NOTES:    Patient had infection of his salivary gland and is on antibiotic therapy.    * Signed by Heloise Purpura, M.D. on 03/01/23 at 5:44 PM (EST)*

## 2023-03-03 ENCOUNTER — Other Ambulatory Visit: Payer: Self-pay

## 2023-03-03 ENCOUNTER — Encounter (HOSPITAL_COMMUNITY)
Admission: RE | Disposition: A | Discharge status: Home / Self Care | Payer: Self-pay | Source: Home / Self Care | Attending: Urology

## 2023-03-03 ENCOUNTER — Ambulatory Visit (HOSPITAL_BASED_OUTPATIENT_CLINIC_OR_DEPARTMENT_OTHER): Payer: Commercial Managed Care - PPO | Admitting: Certified Registered"

## 2023-03-03 ENCOUNTER — Observation Stay (HOSPITAL_COMMUNITY)
Admission: RE | Admit: 2023-03-03 | Discharge: 2023-03-04 | Disposition: A | Discharge status: Home / Self Care | Payer: Commercial Managed Care - PPO | Attending: Urology | Admitting: Urology

## 2023-03-03 ENCOUNTER — Encounter (HOSPITAL_COMMUNITY): Payer: Self-pay | Admitting: Urology

## 2023-03-03 ENCOUNTER — Ambulatory Visit (HOSPITAL_COMMUNITY): Payer: Commercial Managed Care - PPO | Admitting: Certified Registered"

## 2023-03-03 DIAGNOSIS — F17200 Nicotine dependence, unspecified, uncomplicated: Secondary | ICD-10-CM | POA: Diagnosis not present

## 2023-03-03 DIAGNOSIS — C61 Malignant neoplasm of prostate: Principal | ICD-10-CM | POA: Diagnosis present

## 2023-03-03 HISTORY — PX: LYMPHADENECTOMY: SHX5960

## 2023-03-03 HISTORY — PX: ROBOT ASSISTED LAPAROSCOPIC RADICAL PROSTATECTOMY: SHX5141

## 2023-03-03 LAB — HEMOGLOBIN AND HEMATOCRIT, BLOOD
HCT: 45.1 % (ref 39.0–52.0)
Hemoglobin: 14.4 g/dL (ref 13.0–17.0)

## 2023-03-03 LAB — BASIC METABOLIC PANEL
Anion gap: 13 (ref 5–15)
BUN: 23 mg/dL — ABNORMAL HIGH (ref 6–20)
CO2: 22 mmol/L (ref 22–32)
Calcium: 9.6 mg/dL (ref 8.9–10.3)
Chloride: 103 mmol/L (ref 98–111)
Creatinine, Ser: 1.06 mg/dL (ref 0.61–1.24)
GFR, Estimated: >60 mL/min (ref >60)
Glucose, Bld: 111 mg/dL — ABNORMAL HIGH (ref 70–99)
Potassium: 3.6 mmol/L (ref 3.5–5.1)
Sodium: 138 mmol/L (ref 135–145)

## 2023-03-03 LAB — ABO/RH: ABO/RH(D): O POS

## 2023-03-03 LAB — TYPE AND SCREEN
ABO/RH(D): O POS
Antibody Screen: NEGATIVE

## 2023-03-03 SURGERY — XI ROBOTIC ASSISTED LAPAROSCOPIC RADICAL PROSTATECTOMY LEVEL 2
Anesthesia: General | Site: Abdomen

## 2023-03-03 MED ORDER — HYDROCODONE-ACETAMINOPHEN 5-325 MG PO TABS: 1.0000 | ORAL_TABLET | Freq: Four times a day (QID) | ORAL | 0 refills | Status: AC | PRN

## 2023-03-03 MED ORDER — OXYCODONE HCL 5 MG/5ML PO SOLN: 5.0000 mg | Freq: Once | ORAL | Status: AC | PRN

## 2023-03-03 MED ORDER — EPHEDRINE 5 MG/ML INJ
INTRAVENOUS | Status: AC
  Filled 2023-03-03: qty 5

## 2023-03-03 MED ORDER — KCL IN DEXTROSE-NACL 20-5-0.45 MEQ/L-%-% IV SOLN
INTRAVENOUS | Status: DC
  Filled 2023-03-03 (×4): qty 1000

## 2023-03-03 MED ORDER — ONDANSETRON HCL 4 MG/2ML IJ SOLN
INTRAMUSCULAR | Status: AC
  Filled 2023-03-03: qty 2

## 2023-03-03 MED ORDER — LACTATED RINGERS IV SOLN: INTRAVENOUS | Status: DC

## 2023-03-03 MED ORDER — SODIUM CHLORIDE 0.9 % IV BOLUS
1000.0000 mL | Freq: Once | INTRAVENOUS | Status: AC
  Administered 2023-03-03: 1000 mL via INTRAVENOUS

## 2023-03-03 MED ORDER — CEFAZOLIN SODIUM-DEXTROSE 2-4 GM/100ML-% IV SOLN
2.0000 g | INTRAVENOUS | Status: AC
Start: 2023-03-03 — End: 2023-03-03
  Administered 2023-03-03: 2 g via INTRAVENOUS
  Filled 2023-03-03: qty 100

## 2023-03-03 MED ORDER — ACETAMINOPHEN 500 MG PO TABS
ORAL_TABLET | ORAL | Status: AC
  Filled 2023-03-03: qty 2

## 2023-03-03 MED ORDER — CHLORHEXIDINE GLUCONATE 0.12 % MT SOLN
15.0000 mL | Freq: Once | OROMUCOSAL | Status: AC
  Administered 2023-03-03: 15 mL via OROMUCOSAL

## 2023-03-03 MED ORDER — KETAMINE HCL 10 MG/ML IJ SOLN
INTRAMUSCULAR | Status: DC | PRN
Start: 2023-03-03 — End: 2023-03-03
  Administered 2023-03-03: 5 mg via INTRAVENOUS
  Administered 2023-03-03: 20 mg via INTRAVENOUS
  Administered 2023-03-03: 10 mg via INTRAVENOUS

## 2023-03-03 MED ORDER — STERILE WATER FOR IRRIGATION IR SOLN
Status: DC | PRN
  Administered 2023-03-03: 1000 mL

## 2023-03-03 MED ORDER — MIDAZOLAM HCL 2 MG/2ML IJ SOLN
INTRAMUSCULAR | Status: DC | PRN
  Administered 2023-03-03: 2 mg via INTRAVENOUS

## 2023-03-03 MED ORDER — SUCCINYLCHOLINE CHLORIDE 200 MG/10ML IV SOSY
PREFILLED_SYRINGE | INTRAVENOUS | Status: DC | PRN
  Administered 2023-03-03: 180 mg via INTRAVENOUS

## 2023-03-03 MED ORDER — MORPHINE SULFATE (PF) 2 MG/ML IV SOLN
2.0000 mg | INTRAVENOUS | Status: DC | PRN
  Administered 2023-03-03: 4 mg via INTRAVENOUS
  Filled 2023-03-03: qty 2

## 2023-03-03 MED ORDER — ACETAMINOPHEN 10 MG/ML IV SOLN: 1000.0000 mg | Freq: Once | INTRAVENOUS | Status: DC | PRN

## 2023-03-03 MED ORDER — KETOROLAC TROMETHAMINE 15 MG/ML IJ SOLN
15.0000 mg | Freq: Four times a day (QID) | INTRAMUSCULAR | Status: DC
  Administered 2023-03-03 – 2023-03-04 (×4): 15 mg via INTRAVENOUS
  Filled 2023-03-03 (×4): qty 1

## 2023-03-03 MED ORDER — FENTANYL CITRATE (PF) 100 MCG/2ML IJ SOLN
INTRAMUSCULAR | Status: AC
  Filled 2023-03-03: qty 2

## 2023-03-03 MED ORDER — MIDAZOLAM HCL 2 MG/2ML IJ SOLN
INTRAMUSCULAR | Status: AC
  Filled 2023-03-03: qty 2

## 2023-03-03 MED ORDER — ORAL CARE MOUTH RINSE: 15.0000 mL | Freq: Once | OROMUCOSAL | Status: AC

## 2023-03-03 MED ORDER — BUPIVACAINE-EPINEPHRINE 0.25% -1:200000 IJ SOLN
INTRAMUSCULAR | Status: AC
  Filled 2023-03-03: qty 1

## 2023-03-03 MED ORDER — OXYCODONE HCL 5 MG PO TABS
5.0000 mg | ORAL_TABLET | Freq: Once | ORAL | Status: AC | PRN
  Administered 2023-03-03: 5 mg via ORAL

## 2023-03-03 MED ORDER — SUGAMMADEX SODIUM 200 MG/2ML IV SOLN
INTRAVENOUS | Status: DC | PRN
  Administered 2023-03-03: 200 mg via INTRAVENOUS

## 2023-03-03 MED ORDER — ACETAMINOPHEN 325 MG PO TABS: 650.0000 mg | ORAL_TABLET | ORAL | Status: DC | PRN

## 2023-03-03 MED ORDER — OXYCODONE HCL 5 MG PO TABS
ORAL_TABLET | ORAL | Status: AC
  Filled 2023-03-03: qty 1

## 2023-03-03 MED ORDER — ONDANSETRON HCL 4 MG/2ML IJ SOLN
INTRAMUSCULAR | Status: DC | PRN
  Administered 2023-03-03: 4 mg via INTRAVENOUS

## 2023-03-03 MED ORDER — LIDOCAINE 2% (20 MG/ML) 5 ML SYRINGE
INTRAMUSCULAR | Status: DC | PRN
  Administered 2023-03-03: 80 mg via INTRAVENOUS

## 2023-03-03 MED ORDER — HYOSCYAMINE SULFATE 0.125 MG SL SUBL: 0.1250 mg | SUBLINGUAL_TABLET | Freq: Four times a day (QID) | SUBLINGUAL | Status: DC | PRN

## 2023-03-03 MED ORDER — TRIPLE ANTIBIOTIC 3.5-400-5000 EX OINT: 1.0000 | TOPICAL_OINTMENT | Freq: Three times a day (TID) | CUTANEOUS | Status: DC | PRN

## 2023-03-03 MED ORDER — DOCUSATE SODIUM 100 MG PO CAPS
100.0000 mg | ORAL_CAPSULE | Freq: Two times a day (BID) | ORAL | Status: DC
  Administered 2023-03-03 – 2023-03-04 (×2): 100 mg via ORAL
  Filled 2023-03-03 (×2): qty 1

## 2023-03-03 MED ORDER — FENTANYL CITRATE PF 50 MCG/ML IJ SOSY: 25.0000 ug | PREFILLED_SYRINGE | INTRAMUSCULAR | Status: DC | PRN

## 2023-03-03 MED ORDER — PROPOFOL 10 MG/ML IV BOLUS
INTRAVENOUS | Status: DC | PRN
  Administered 2023-03-03: 80 mg via INTRAVENOUS
  Administered 2023-03-03: 120 mg via INTRAVENOUS

## 2023-03-03 MED ORDER — SULFAMETHOXAZOLE-TRIMETHOPRIM 800-160 MG PO TABS: 1.0000 | ORAL_TABLET | Freq: Two times a day (BID) | ORAL | 0 refills | Status: AC

## 2023-03-03 MED ORDER — PROPOFOL 10 MG/ML IV BOLUS
INTRAVENOUS | Status: AC
  Filled 2023-03-03: qty 20

## 2023-03-03 MED ORDER — ONDANSETRON HCL 4 MG/2ML IJ SOLN: 4.0000 mg | INTRAMUSCULAR | Status: DC | PRN

## 2023-03-03 MED ORDER — BUPIVACAINE-EPINEPHRINE 0.25% -1:200000 IJ SOLN
INTRAMUSCULAR | Status: DC | PRN
Start: 2023-03-03 — End: 2023-03-03
  Administered 2023-03-03: 27 mL

## 2023-03-03 MED ORDER — DOCUSATE SODIUM 100 MG PO CAPS: 100.0000 mg | ORAL_CAPSULE | Freq: Two times a day (BID) | ORAL | Status: AC

## 2023-03-03 MED ORDER — ROCURONIUM BROMIDE 10 MG/ML (PF) SYRINGE
PREFILLED_SYRINGE | INTRAVENOUS | Status: AC
  Filled 2023-03-03: qty 10

## 2023-03-03 MED ORDER — AMOXICILLIN-POT CLAVULANATE 875-125 MG PO TABS
1.0000 | ORAL_TABLET | Freq: Two times a day (BID) | ORAL | Status: DC
  Administered 2023-03-03 – 2023-03-04 (×2): 1 via ORAL
  Filled 2023-03-03 (×2): qty 1

## 2023-03-03 MED ORDER — DEXAMETHASONE SODIUM PHOSPHATE 10 MG/ML IJ SOLN
INTRAMUSCULAR | Status: DC | PRN
  Administered 2023-03-03: 10 mg via INTRAVENOUS

## 2023-03-03 MED ORDER — FENTANYL CITRATE (PF) 100 MCG/2ML IJ SOLN
INTRAMUSCULAR | Status: DC | PRN
  Administered 2023-03-03 (×2): 25 ug via INTRAVENOUS
  Administered 2023-03-03: 50 ug via INTRAVENOUS
  Administered 2023-03-03 (×2): 100 ug via INTRAVENOUS

## 2023-03-03 MED ORDER — ROCURONIUM BROMIDE 10 MG/ML (PF) SYRINGE
PREFILLED_SYRINGE | INTRAVENOUS | Status: DC | PRN
  Administered 2023-03-03: 20 mg via INTRAVENOUS
  Administered 2023-03-03: 10 mg via INTRAVENOUS
  Administered 2023-03-03: 20 mg via INTRAVENOUS
  Administered 2023-03-03: 50 mg via INTRAVENOUS

## 2023-03-03 MED ORDER — ZOLPIDEM TARTRATE 5 MG PO TABS: 5.0000 mg | ORAL_TABLET | Freq: Every evening | ORAL | Status: DC | PRN

## 2023-03-03 MED ORDER — PANTOPRAZOLE SODIUM 40 MG PO TBEC
40.0000 mg | DELAYED_RELEASE_TABLET | Freq: Two times a day (BID) | ORAL | Status: DC
  Administered 2023-03-03 – 2023-03-04 (×2): 40 mg via ORAL
  Filled 2023-03-03 (×2): qty 1

## 2023-03-03 MED ORDER — ACETAMINOPHEN 160 MG/5ML PO SOLN: 1000.0000 mg | Freq: Once | ORAL | Status: AC | PRN

## 2023-03-03 MED ORDER — KETAMINE HCL 50 MG/5ML IJ SOSY
PREFILLED_SYRINGE | INTRAMUSCULAR | Status: AC
  Filled 2023-03-03: qty 5

## 2023-03-03 MED ORDER — SUCCINYLCHOLINE CHLORIDE 200 MG/10ML IV SOSY
PREFILLED_SYRINGE | INTRAVENOUS | Status: AC
  Filled 2023-03-03: qty 10

## 2023-03-03 MED ORDER — FENTANYL CITRATE (PF) 100 MCG/2ML IJ SOLN
INTRAMUSCULAR | Status: AC
Start: 2023-03-03 — End: ?
  Filled 2023-03-03: qty 2

## 2023-03-03 MED ORDER — LIDOCAINE HCL (PF) 2 % IJ SOLN
INTRAMUSCULAR | Status: AC
  Filled 2023-03-03: qty 5

## 2023-03-03 MED ORDER — DIPHENHYDRAMINE HCL 50 MG/ML IJ SOLN
12.5000 mg | Freq: Four times a day (QID) | INTRAMUSCULAR | Status: DC | PRN
  Administered 2023-03-03: 12.5 mg via INTRAVENOUS
  Filled 2023-03-03: qty 1

## 2023-03-03 MED ORDER — CEFAZOLIN SODIUM-DEXTROSE 1-4 GM/50ML-% IV SOLN
1.0000 g | Freq: Three times a day (TID) | INTRAVENOUS | Status: AC
  Administered 2023-03-03 – 2023-03-04 (×2): 1 g via INTRAVENOUS
  Filled 2023-03-03 (×2): qty 50

## 2023-03-03 MED ORDER — DIPHENHYDRAMINE HCL 12.5 MG/5ML PO ELIX
12.5000 mg | ORAL_SOLUTION | Freq: Four times a day (QID) | ORAL | Status: DC | PRN
  Filled 2023-03-03: qty 5

## 2023-03-03 MED ORDER — DEXAMETHASONE SODIUM PHOSPHATE 10 MG/ML IJ SOLN
INTRAMUSCULAR | Status: AC
  Filled 2023-03-03: qty 1

## 2023-03-03 MED ORDER — ACETAMINOPHEN 500 MG PO TABS
1000.0000 mg | ORAL_TABLET | Freq: Once | ORAL | Status: AC | PRN
  Administered 2023-03-03: 1000 mg via ORAL

## 2023-03-03 MED ORDER — GABAPENTIN 300 MG PO CAPS
600.0000 mg | ORAL_CAPSULE | Freq: Every day | ORAL | Status: DC
  Administered 2023-03-03: 600 mg via ORAL
  Filled 2023-03-03: qty 2

## 2023-03-03 SURGICAL SUPPLY — 59 items
APPLICATOR COTTON TIP 6 STRL (MISCELLANEOUS) ×3 IMPLANT
APPLICATOR COTTON TIP 6IN STRL (MISCELLANEOUS) ×2
BAG COUNTER SPONGE SURGICOUNT (BAG) IMPLANT
CATH FOLEY 2WAY SLVR 18FR 30CC (CATHETERS) ×3 IMPLANT
CATH ROBINSON RED A/P 16FR (CATHETERS) ×3 IMPLANT
CATH ROBINSON RED A/P 8FR (CATHETERS) ×3 IMPLANT
CATH TIEMANN FOLEY 18FR 5CC (CATHETERS) ×3 IMPLANT
CHLORAPREP W/TINT 26 (MISCELLANEOUS) ×3 IMPLANT
CLIP LIGATING HEM O LOK PURPLE (MISCELLANEOUS) ×3 IMPLANT
COVER SURGICAL LIGHT HANDLE (MISCELLANEOUS) ×3 IMPLANT
COVER TIP SHEARS 8 DVNC (MISCELLANEOUS) ×3 IMPLANT
CUTTER ECHEON FLEX ENDO 45 340 (ENDOMECHANICALS) ×3 IMPLANT
DERMABOND ADVANCED .7 DNX12 (GAUZE/BANDAGES/DRESSINGS) ×3 IMPLANT
DERMABOND ADVANCED .7 DNX6 (GAUZE/BANDAGES/DRESSINGS) IMPLANT
DRAIN CHANNEL RND F F (WOUND CARE) IMPLANT
DRAPE ARM DVNC X/XI (DISPOSABLE) ×12 IMPLANT
DRAPE COLUMN DVNC XI (DISPOSABLE) ×3 IMPLANT
DRAPE SURG IRRIG POUCH 19X23 (DRAPES) ×3 IMPLANT
DRIVER NDL LRG 8 DVNC XI (INSTRUMENTS) ×6 IMPLANT
DRIVER NDLE LRG 8 DVNC XI (INSTRUMENTS) ×4
DRSG TEGADERM 4X4.75 (GAUZE/BANDAGES/DRESSINGS) ×3 IMPLANT
ELECT PENCIL ROCKER SW 15FT (MISCELLANEOUS) ×3 IMPLANT
ELECT REM PT RETURN 15FT ADLT (MISCELLANEOUS) ×3 IMPLANT
FORCEPS BPLR LNG DVNC XI (INSTRUMENTS) ×3 IMPLANT
FORCEPS PROGRASP DVNC XI (FORCEP) ×3 IMPLANT
GAUZE SPONGE 4X4 12PLY STRL (GAUZE/BANDAGES/DRESSINGS) ×3 IMPLANT
GLOVE BIO SURGEON STRL SZ 6.5 (GLOVE) ×3 IMPLANT
GLOVE SURG LX STRL 7.5 STRW (GLOVE) ×6 IMPLANT
GOWN STRL REUS W/ TWL XL LVL3 (GOWN DISPOSABLE) ×6 IMPLANT
GOWN STRL SURGICAL XL XLNG (GOWN DISPOSABLE) ×3 IMPLANT
HOLDER FOLEY CATH W/STRAP (MISCELLANEOUS) ×3 IMPLANT
IRRIG SUCT STRYKERFLOW 2 WTIP (MISCELLANEOUS) ×2
IRRIGATION SUCT STRKRFLW 2 WTP (MISCELLANEOUS) ×3 IMPLANT
IV LACTATED RINGERS 1000ML (IV SOLUTION) ×3 IMPLANT
KIT TURNOVER KIT A (KITS) IMPLANT
NDL SAFETY ECLIPSE 18X1.5 (NEEDLE) IMPLANT
PACK ROBOT UROLOGY CUSTOM (CUSTOM PROCEDURE TRAY) ×3 IMPLANT
PLUG CATH AND CAP STRL 200 (CATHETERS) ×3 IMPLANT
RELOAD STAPLE 45 4.1 GRN THCK (STAPLE) ×3 IMPLANT
SCISSORS MNPLR CVD DVNC XI (INSTRUMENTS) ×3 IMPLANT
SEAL UNIV 5-12 XI (MISCELLANEOUS) ×12 IMPLANT
SET CYSTO W/LG BORE CLAMP LF (SET/KITS/TRAYS/PACK) IMPLANT
SET TUBE SMOKE EVAC HIGH FLOW (TUBING) ×3 IMPLANT
SOL ELECTROSURG ANTI STICK (MISCELLANEOUS) ×2
SOL PREP POV-IOD 4OZ 10% (MISCELLANEOUS) ×3 IMPLANT
SOLUTION ELECTROSURG ANTI STCK (MISCELLANEOUS) ×3 IMPLANT
SPIKE FLUID TRANSFER (MISCELLANEOUS) ×3 IMPLANT
STAPLE RELOAD 45 GRN (STAPLE) ×2
SUT ETHILON 3 0 PS 1 (SUTURE) ×3 IMPLANT
SUT MNCRL 3 0 RB1 (SUTURE) ×3 IMPLANT
SUT MNCRL 3 0 VIOLET RB1 (SUTURE) ×3 IMPLANT
SUT MNCRL AB 4-0 PS2 18 (SUTURE) ×6 IMPLANT
SUT PDS PLUS AB 0 CT-2 (SUTURE) ×6 IMPLANT
SUT VIC AB 0 CT1 27XBRD ANTBC (SUTURE) ×6 IMPLANT
SUT VIC AB 2-0 SH 27X BRD (SUTURE) ×3 IMPLANT
SUT VIC AB 3-0 SH 27X BRD (SUTURE) IMPLANT
SYR 27GX1/2 1ML LL SAFETY (SYRINGE) ×3 IMPLANT
TROCAR Z THREAD OPTICAL 12X100 (TROCAR) IMPLANT
WATER STERILE IRR 1000ML POUR (IV SOLUTION) ×3 IMPLANT

## 2023-03-03 NOTE — Plan of Care (Signed)
  Problem: Urinary Elimination: Goal: Ability to avoid or minimize complications of infection will improve Outcome: Progressing   Problem: Urinary Elimination: Goal: Ability to achieve and maintain urine output will improve Outcome: Progressing   Problem: Pain Management: Goal: General experience of comfort will improve Outcome: Progressing

## 2023-03-03 NOTE — Progress Notes (Signed)
Patient ID: Jason Price, male   DOB: 1965-06-09, 58 y.o.   MRN: [id number]  Post-op note  Subjective: The patient is doing well.  No complaints.  Objective: Vital signs in last 24 hours: Temp:  [98.2 F (36.8 C)-99.3 F (37.4 C)] 99.3 F (37.4 C) (02/20 1530) Pulse Rate:  [94-112] 104 (02/20 1600) Resp:  [15-18] 15 (02/20 1600) BP: (137-159)/(92-140) 147/97 (02/20 1600) SpO2:  [93 %-99 %] 93 % (02/20 1600) Weight:  [101.6 kg] 101.6 kg (02/20 0906)  Intake/Output from previous day: No intake/output data recorded. Intake/Output this shift: Total I/O In: 1420 [I.V.:1320; IV Piggyback:100] Out: 150 [Blood:150]  Physical Exam:  General: Alert and oriented. Abdomen: Soft, Nondistended. Incisions: Clean and dry. GU: Urine red but draining well.  Lab Results: No results for input(s): "HGB", "HCT" in the last 72 hours.  Assessment/Plan: POD#0   1) Continue to monitor, ambulate, IS   Jason Price. MD   LOS: 0 days   Jason Price 03/03/2023, 4:28 PM

## 2023-03-03 NOTE — Progress Notes (Signed)
   03/03/23 2247  BiPAP/CPAP/SIPAP  $ Non-Invasive Home Ventilator  Initial  BiPAP/CPAP/SIPAP Pt Type Adult (machine plugged into red outlet)  BiPAP/CPAP/SIPAP Resmed  Mask Type Nasal pillows (Patietn nasal pillows and tubing from home)  Dentures removed? Not applicable  Respiratory Rate 16 breaths/min  FiO2 (%) 21 %  Patient Home Equipment No  Auto Titrate Yes (V-auto 4-20 cm H2O)

## 2023-03-03 NOTE — Transfer of Care (Signed)
Immediate Anesthesia Transfer of Care Note  Patient: Jason Price  Procedure(s) Performed: XI ROBOTIC ASSISTED LAPAROSCOPIC RADICAL PROSTATECTOMY LEVEL 2 (Abdomen) BILATERAL PELVIC LYMPHADENECTOMY (Bilateral: Abdomen)  Patient Location: PACU  Anesthesia Type:General  Level of Consciousness: awake, alert , and patient cooperative  Airway & Oxygen Therapy: Patient Spontanous Breathing and Patient connected to face mask oxygen  Post-op Assessment: Report given to RN and Post -op Vital signs reviewed and stable  Post vital signs: Reviewed and stable  Last Vitals:  Vitals Value Taken Time  BP 179/106 03/03/23 1526  Temp    Pulse 113 03/03/23 1528  Resp 13 03/03/23 1528  SpO2 100 % 03/03/23 1528  Vitals shown include unfiled device data.  Last Pain:  Vitals:   03/03/23 0906  TempSrc:   PainSc: 0-No pain         Complications:  Encounter Notable Events  Notable Event Outcome Phase Comment  Difficult to intubate - expected  Intraprocedure Filed from anesthesia note documentation.

## 2023-03-03 NOTE — Discharge Instructions (Signed)

## 2023-03-03 NOTE — Anesthesia Procedure Notes (Signed)
Procedure Name: Intubation Date/Time: 03/03/2023 12:51 PM  Performed by: Sindy Guadeloupe, CRNAPre-anesthesia Checklist: Patient identified, Emergency Drugs available, Suction available, Patient being monitored and Timeout performed Patient Re-evaluated:Patient Re-evaluated prior to induction Oxygen Delivery Method: Circle system utilized Preoxygenation: Pre-oxygenation with 100% oxygen Induction Type: IV induction Ventilation: Oral airway inserted - appropriate to patient size and Two handed mask ventilation required Laryngoscope Size: Glidescope and 4 Grade View: Grade I Tube type: Oral Tube size: 7.5 mm Number of attempts: 1 Airway Equipment and Method: Stylet Placement Confirmation: ETT inserted through vocal cords under direct vision, positive ETCO2 and breath sounds checked- equal and bilateral Secured at: 23 cm Tube secured with: Tape Dental Injury: Teeth and Oropharynx as per pre-operative assessment  Difficulty Due To: Difficulty was anticipated and Difficult Airway- due to limited oral opening

## 2023-03-03 NOTE — Progress Notes (Addendum)
Approximately 1900, writer notified that patient was bleeding from his lap site. Upon assessment, noted bleeding from one of the lap sites on right side of abdomen. Reinforced the site with gauze and Tegaderm. Patient was asymptomatic and remained alert and oriented. Paged the on-call provider for [facility]. After speaking with the on-call provider, writer noted patient was bleeding from another lap site. Paged on-call provider again to communicate this information. Received inbound call from Dr. Liliane Shi. Advised him of same information. Dr. Liliane Shi instructed to reinforce the lap sites with gauze and Medipore tape. PM shift nurse and PM charge nurse were bedside and were made aware to continue to monitor sites per Dr. Liliane Shi and advise of any changes in patient's status. Wife and patient's mother were also bedside and aware of these events.

## 2023-03-03 NOTE — Interval H&P Note (Signed)
History and Physical Interval Note:  03/03/2023 11:39 AM  Jason Price  has presented today for surgery, with the diagnosis of PROSTATE CANCER.  The various methods of treatment have been discussed with the patient and family. After consideration of risks, benefits and other options for treatment, the patient has consented to  Procedure(s) with comments: XI ROBOTIC ASSISTED LAPAROSCOPIC RADICAL PROSTATECTOMY LEVEL 2 (N/A) - 210 MINUTES NEEDED FOR CASE BILATERAL PELVIC LYMPHADENECTOMY (Bilateral) as a surgical intervention.  The patient's history has been reviewed, patient examined, no change in status, stable for surgery.  I have reviewed the patient's chart and labs.  Questions were answered to the patient's satisfaction.     [employer]

## 2023-03-03 NOTE — Op Note (Signed)
Preoperative diagnosis: Clinically localized adenocarcinoma of the prostate (clinical stage T1c N0 M0)  Postoperative diagnosis: Clinically localized adenocarcinoma of the prostate (clinical stage T1c N0 M0)  Procedure:  Robotic assisted laparoscopic radical prostatectomy (bilateral nerve sparing) Bilateral robotic assisted laparoscopic pelvic lymphadenectomy  Surgeon: Moody Bruins. M.D.  Assistant: Harrie Foreman, PA-C  An assistant was required for this surgical procedure.  The duties of the assistant included but were not limited to suctioning, passing suture, camera manipulation, retraction. This procedure would not be able to be performed without an Geophysicist/field seismologist.  Anesthesia: General  Complications: None  EBL: 150 mL  IVF:  1300 mL crystalloid  Specimens: Prostate and seminal vesicles Right pelvic lymph nodes Left pelvic lymph nodes  Disposition of specimens: Pathology  Drains: 20 Fr coude catheter # 19 Blake pelvic drain  Indication: Jason Price is a 58 y.o. year old patient with clinically localized prostate cancer.  After a thorough review of the management options for treatment of prostate cancer, he elected to proceed with surgical therapy and the above procedure(s).  We have discussed the potential benefits and risks of the procedure, side effects of the proposed treatment, the likelihood of the patient achieving the goals of the procedure, and any potential problems that might occur during the procedure or recuperation. Informed consent has been obtained.  Description of procedure:  The patient was taken to the operating room and a general anesthetic was administered. He was given preoperative antibiotics, placed in the dorsal lithotomy position, and prepped and draped in the usual sterile fashion. Next a preoperative timeout was performed. A urethral catheter was placed into the bladder and a site was selected near the umbilicus for placement of the camera  port. This was placed using a standard open Hassan technique which allowed entry into the peritoneal cavity under direct vision and without difficulty. An 8 mm robotic port was placed and a pneumoperitoneum established. The camera was then used to inspect the abdomen and there was no evidence of any intra-abdominal injuries or other abnormalities. The remaining abdominal ports were then placed. 8 mm robotic ports were placed in the right lower quadrant, left lower quadrant, and far left lateral abdominal wall. A 5 mm port was placed in the right upper quadrant and a 12 mm port was placed in the right lateral abdominal wall for laparoscopic assistance. All ports were placed under direct vision without difficulty. The surgical cart was then docked.   Utilizing the cautery scissors, the bladder was reflected posteriorly allowing entry into the space of Retzius and identification of the endopelvic fascia and prostate. The periprostatic fat was then removed from the prostate allowing full exposure of the endopelvic fascia. The endopelvic fascia was then incised from the apex back to the base of the prostate bilaterally and the underlying levator muscle fibers were swept laterally off the prostate thereby isolating the dorsal venous complex. The dorsal vein was then stapled and divided with a 45 mm Flex Echelon stapler. Attention then turned to the bladder neck which was divided anteriorly thereby allowing entry into the bladder and exposure of the urethral catheter. The catheter balloon was deflated and the catheter was brought into the operative field and used to retract the prostate anteriorly. The posterior bladder neck was then examined and was divided allowing further dissection between the bladder and prostate posteriorly until the vasa deferentia and seminal vessels were identified. The vasa deferentia were isolated, divided, and lifted anteriorly. The seminal vesicles were dissected down to  their tips with care  to control the seminal vascular arterial blood supply. These structures were then lifted anteriorly and the space between Denonvillier's fascia and the anterior rectum was developed with a combination of sharp and blunt dissection. This isolated the vascular pedicles of the prostate.  The lateral prostatic fascia was then sharply incised allowing release of the neurovascular bundles bilaterally. The vascular pedicles of the prostate were then ligated with Weck clips between the prostate and neurovascular bundles and divided with sharp cold scissor dissection resulting in neurovascular bundle preservation. The neurovascular bundles were then separated off the apex of the prostate and urethra bilaterally.  The urethra was then sharply transected allowing the prostate specimen to be disarticulated. The pelvis was copiously irrigated and hemostasis was ensured. There was no evidence for rectal injury.  Attention then turned to the right pelvic sidewall. The fibrofatty tissue between the external iliac vein, confluence of the iliac vessels, hypogastric artery, and Cooper's ligament was dissected free from the pelvic sidewall with care to preserve the obturator nerve. Weck clips were used for lymphostasis and hemostasis. An identical procedure was performed on the contralateral side and the lymphatic packets were removed for permanent pathologic analysis.  Attention then turned to the urethral anastomosis. A 2-0 Vicryl slip knot was placed between Denonvillier's fascia, the posterior bladder neck, and the posterior urethra to reapproximate these structures. A double-armed 3-0 Monocryl suture was then used to perform a 360 running tension-free anastomosis between the bladder neck and urethra. A new urethral catheter was then placed into the bladder and irrigated. There were no blood clots within the bladder and the anastomosis appeared to be watertight. A #19 Blake drain was then brought through the left lateral 8  mm port site and positioned appropriately within the pelvis. It was secured to the skin with a nylon suture. The surgical cart was then undocked. The right lateral 12 mm port site was closed at the fascial level with a 0 Vicryl suture placed laparoscopically. All remaining ports were then removed under direct vision. The prostate specimen was removed intact within the Endopouch retrieval bag via the periumbilical camera port site. This fascial opening was closed with two running 0 PDS sutures. 0.25% Marcaine was then injected into all port sites and all incisions were reapproximated at the skin level with 4-0 Monocryl subcuticular sutures and Dermabond. The patient appeared to tolerate the procedure well and without complications. The patient was able to be extubated and transferred to the recovery unit in satisfactory condition.   Moody Bruins MD

## 2023-03-03 NOTE — Anesthesia Preprocedure Evaluation (Signed)
Anesthesia Evaluation  Patient identified by MRN, date of birth, ID band Patient awake    Reviewed: Allergy & Precautions, NPO status , Patient's Chart, lab work & pertinent test results  History of Anesthesia Complications Negative for: history of anesthetic complications  Airway Mallampati: IV  TM Distance: >3 FB Neck ROM: Full  Mouth opening: Limited Mouth Opening  Dental  (+) Teeth Intact, Dental Advisory Given   Pulmonary neg shortness of breath, sleep apnea and Continuous Positive Airway Pressure Ventilation , neg COPD, neg recent URI, Current Smoker and Patient abstained from smoking.   breath sounds clear to auscultation       Cardiovascular hypertension,  Rhythm:Regular     Neuro/Psych neg Seizures    GI/Hepatic Neg liver ROS, hiatal hernia,GERD  Medicated and Controlled,,(+)     substance abuse  alcohol use  Endo/Other  negative endocrine ROS    Renal/GU negative Renal ROSLab Results      Component                Value               Date                      NA                       138                 03/03/2023                K                        3.6                 03/03/2023                CO2                      22                  03/03/2023                GLUCOSE                  111 (H)             03/03/2023                BUN                      23 (H)              03/03/2023                CREATININE               1.06                03/03/2023                CALCIUM                  9.6                 03/03/2023                GFRNONAA                 >  60                 03/03/2023                Musculoskeletal negative musculoskeletal ROS (+)    Abdominal   Peds  Hematology Lab Results      Component                Value               Date                      WBC                      7.7                 02/23/2023                HGB                      15.4                 02/23/2023                HCT                      45.9                02/23/2023                MCV                      92.2                02/23/2023                PLT                      181                 02/23/2023              Anesthesia Other Findings   Reproductive/Obstetrics                             Anesthesia Physical Anesthesia Plan  ASA: 2  Anesthesia Plan: General   Post-op Pain Management: Ofirmev IV (intra-op)* and Toradol IV (intra-op)*   Induction: Intravenous  PONV Risk Score and Plan: 2 and Ondansetron and Dexamethasone  Airway Management Planned: Oral ETT and Video Laryngoscope Planned  Additional Equipment: None  Intra-op Plan:   Post-operative Plan: Extubation in OR  Informed Consent: I have reviewed the patients History and Physical, chart, labs and discussed the procedure including the risks, benefits and alternatives for the proposed anesthesia with the patient or authorized representative who has indicated his/her understanding and acceptance.     Dental advisory given  Plan Discussed with: CRNA  Anesthesia Plan Comments: (Salivary gland infection left, on antibiotics, impacts mouth opening)       Anesthesia Quick Evaluation

## 2023-03-04 ENCOUNTER — Encounter (HOSPITAL_COMMUNITY): Payer: Self-pay | Admitting: Urology

## 2023-03-04 DIAGNOSIS — C61 Malignant neoplasm of prostate: Secondary | ICD-10-CM | POA: Diagnosis not present

## 2023-03-04 LAB — HEMOGLOBIN AND HEMATOCRIT, BLOOD
HCT: 41.2 % (ref 39.0–52.0)
Hemoglobin: 12.9 g/dL — ABNORMAL LOW (ref 13.0–17.0)

## 2023-03-04 MED ORDER — HYDROCODONE-ACETAMINOPHEN 5-325 MG PO TABS: 1.0000 | ORAL_TABLET | ORAL | Status: DC | PRN

## 2023-03-04 MED ORDER — BISACODYL 10 MG RE SUPP
10.0000 mg | Freq: Once | RECTAL | Status: AC
  Administered 2023-03-04: 10 mg via RECTAL
  Filled 2023-03-04: qty 1

## 2023-03-04 NOTE — Plan of Care (Signed)
  Problem: Education: Goal: Knowledge of the procedure and recovery process will improve Outcome: Progressing   Problem: Skin Integrity: Goal: Demonstration of wound healing without infection will improve Outcome: Progressing   Problem: Education: Goal: Knowledge of General Education information will improve Description: Including pain rating scale, medication(s)/side effects and non-pharmacologic comfort measures Outcome: Progressing

## 2023-03-04 NOTE — Progress Notes (Signed)
Education provided to patient and spouse on foley care and using leg bag. Foley cath intact and patent. Patient was administered afternoon dose of Toradol. Both PIVs removed per order. AVS reviewed with patient and spouse. Questions and concerns were addressed.  All personal belongings given to patient. Patient is aware of future appointment with surgeon. Patient escorted to front entrance in wheelchair. Transportation to home via POV.

## 2023-03-04 NOTE — Progress Notes (Signed)
   03/04/23 0854  TOC Brief Assessment  Insurance and Status Reviewed  Patient has primary care physician Yes  Home environment has been reviewed Resides in single family home with spouse and children  Prior level of function: Independent with ADLs at baseline  Prior/Current Home Services No current home services  Social Drivers of Health Review SDOH reviewed no interventions necessary  Readmission risk has been reviewed Yes  Transition of care needs no transition of care needs at this time

## 2023-03-04 NOTE — Discharge Summary (Signed)
  Date of admission: 03/03/2023  Date of discharge: 03/04/2023  Admission diagnosis: Prostate Cancer  Discharge diagnosis: Prostate Cancer  History and Physical: For full details, please see admission history and physical. Briefly, Jason Price is a 58 y.o. gentleman with localized prostate cancer.  After discussing management/treatment options, he elected to proceed with surgical treatment.  Hospital Course: KEYONDRE HEPBURN was taken to the operating room on 03/03/2023 and underwent a robotic assisted laparoscopic radical prostatectomy. He tolerated this procedure well and without complications. Postoperatively, he was able to be transferred to a regular hospital room following recovery from anesthesia.  He was able to begin ambulating the night of surgery. He remained hemodynamically stable overnight.  He had excellent urine output with appropriately minimal output from his pelvic drain and his pelvic drain was removed on POD #1.  He was transitioned to oral pain medication, tolerated a clear liquid diet, and had met all discharge criteria and was able to be discharged home later on POD#1.  Laboratory values:  Recent Labs    03/03/23 1724 03/04/23 0534  HGB 14.4 12.9*  HCT 45.1 41.2    Disposition: Home  Discharge instruction: He was instructed to be ambulatory but to refrain from heavy lifting, strenuous activity, or driving. He was instructed on urethral catheter care.  Discharge medications:   Allergies as of 03/04/2023       Reactions   Niacin Other (See Comments)   Flushing   Niaspan [niacin Er (antihyperlipidemic)]    Flushing   Simvastatin    Muscle aches        Medication List     TAKE these medications    docusate sodium 100 MG capsule Commonly known as: COLACE Take 1 capsule (100 mg total) by mouth 2 (two) times daily.   gabapentin 300 MG capsule Commonly known as: NEURONTIN Take 600 mg by mouth at bedtime.   HYDROcodone-acetaminophen 5-325 MG  tablet Commonly known as: NORCO/VICODIN Take 1-2 tablets by mouth every 6 (six) hours as needed for moderate pain (pain score 4-6) or severe pain (pain score 7-10). What changed:  how much to take reasons to take this   NON FORMULARY Pt uses a cpap nightly   pantoprazole 40 MG tablet Commonly known as: PROTONIX Take 40 mg by mouth 2 (two) times daily.   sulfamethoxazole-trimethoprim 800-160 MG tablet Commonly known as: BACTRIM DS Take 1 tablet by mouth 2 (two) times daily. Start the day prior to foley removal appointment        Followup: He will followup in 1 week for catheter removal and to discuss his surgical pathology results.

## 2023-03-04 NOTE — Anesthesia Postprocedure Evaluation (Signed)
Anesthesia Post Note  Patient: Jason Price  Procedure(s) Performed: XI ROBOTIC ASSISTED LAPAROSCOPIC RADICAL PROSTATECTOMY LEVEL 2 (Abdomen) BILATERAL PELVIC LYMPHADENECTOMY (Bilateral: Abdomen)     Patient location during evaluation: PACU Anesthesia Type: General Level of consciousness: awake and alert Pain management: pain level controlled Vital Signs Assessment: post-procedure vital signs reviewed and stable Respiratory status: spontaneous breathing, nonlabored ventilation and respiratory function stable Cardiovascular status: blood pressure returned to baseline and stable Postop Assessment: no apparent nausea or vomiting Anesthetic complications: yes   Encounter Notable Events  Notable Event Outcome Phase Comment  Difficult to intubate - expected  Intraprocedure Filed from anesthesia note documentation.                  Phillp Dolores

## 2023-03-04 NOTE — Plan of Care (Signed)
  Problem: Education: Goal: Knowledge of the procedure and recovery process will improve Outcome: Adequate for Discharge   Problem: Bowel/Gastric: Goal: Gastrointestinal status for postoperative course will improve Outcome: Adequate for Discharge   Problem: Pain Management: Goal: General experience of comfort will improve Outcome: Adequate for Discharge   Problem: Skin Integrity: Goal: Demonstration of wound healing without infection will improve Outcome: Adequate for Discharge   Problem: Urinary Elimination: Goal: Ability to avoid or minimize complications of infection will improve Outcome: Adequate for Discharge Goal: Ability to achieve and maintain urine output will improve Outcome: Adequate for Discharge Goal: Home care management will improve Outcome: Adequate for Discharge   Problem: Education: Goal: Knowledge of General Education information will improve Description: Including pain rating scale, medication(s)/side effects and non-pharmacologic comfort measures Outcome: Adequate for Discharge   Problem: Health Behavior/Discharge Planning: Goal: Ability to manage health-related needs will improve Outcome: Adequate for Discharge   Problem: Clinical Measurements: Goal: Ability to maintain clinical measurements within normal limits will improve Outcome: Adequate for Discharge Goal: Will remain free from infection Outcome: Adequate for Discharge Goal: Diagnostic test results will improve Outcome: Adequate for Discharge Goal: Respiratory complications will improve Outcome: Adequate for Discharge Goal: Cardiovascular complication will be avoided Outcome: Adequate for Discharge   Problem: Activity: Goal: Risk for activity intolerance will decrease Outcome: Adequate for Discharge   Problem: Nutrition: Goal: Adequate nutrition will be maintained Outcome: Adequate for Discharge   Problem: Coping: Goal: Level of anxiety will decrease Outcome: Adequate for Discharge    Problem: Elimination: Goal: Will not experience complications related to bowel motility Outcome: Adequate for Discharge Goal: Will not experience complications related to urinary retention Outcome: Adequate for Discharge   Problem: Pain Managment: Goal: General experience of comfort will improve and/or be controlled Outcome: Adequate for Discharge   Problem: Safety: Goal: Ability to remain free from injury will improve Outcome: Adequate for Discharge   Problem: Skin Integrity: Goal: Risk for impaired skin integrity will decrease Outcome: Adequate for Discharge

## 2023-03-04 NOTE — Progress Notes (Signed)
Patient ID: Jason Price, male   DOB: Mar 12, 1965, 58 y.o.   MRN: [id number]  1 Day Post-Op Subjective: The patient is doing well.  No nausea or vomiting. Pain is adequately controlled.  Objective: Vital signs in last 24 hours: Temp:  [97.5 F (36.4 C)-99.3 F (37.4 C)] 97.5 F (36.4 C) (02/21 0451) Pulse Rate:  [65-112] 66 (02/21 0451) Resp:  [11-21] 17 (02/21 0451) BP: (99-159)/(70-140) 130/94 (02/21 0451) SpO2:  [92 %-100 %] 100 % (02/21 0451) FiO2 (%):  [21 %] 21 % (02/20 2247) Weight:  [101.6 kg] 101.6 kg (02/20 0906)  Intake/Output from previous day: 02/20 0701 - 02/21 0700 In: 6044.3 [I.V.:2814; IV Piggyback:3230.3] Out: 1160 [Urine:900; Drains:110; Blood:150] Intake/Output this shift: No intake/output data recorded.  Physical Exam:  General: Alert and oriented. CV: RRR Lungs: Clear bilaterally. GI: Soft, Nondistended. Incisions: Clean, dry, and intact Urine: Clear Extremities: Nontender, no erythema, no edema.  Lab Results: Recent Labs    03/03/23 1724 03/04/23 0534  HGB 14.4 12.9*  HCT 45.1 41.2      Assessment/Plan: POD# 1 s/p robotic prostatectomy.  1) SL IVF 2) Ambulate, Incentive spirometry 3) Transition to oral pain medication 4) Dulcolax suppository 5) D/C pelvic drain 6) Plan for likely discharge later today   Jason Price. MD   LOS: 0 days   Jason Price 03/04/2023, 7:38 AM

## 2023-03-08 LAB — SURGICAL PATHOLOGY

## 2023-07-04 ENCOUNTER — Encounter: Payer: Self-pay | Admitting: Otolaryngology

## 2023-07-04 ENCOUNTER — Other Ambulatory Visit: Payer: Self-pay | Admitting: Otolaryngology

## 2023-07-04 DIAGNOSIS — R600 Localized edema: Secondary | ICD-10-CM

## 2023-07-06 ENCOUNTER — Other Ambulatory Visit

## 2023-07-07 ENCOUNTER — Encounter: Payer: Self-pay | Admitting: Radiology

## 2023-07-07 ENCOUNTER — Ambulatory Visit
Admission: RE | Admit: 2023-07-07 | Discharge: 2023-07-07 | Disposition: A | Discharge status: Home / Self Care | Source: Ambulatory Visit | Attending: Otolaryngology | Admitting: Otolaryngology

## 2023-07-07 DIAGNOSIS — R600 Localized edema: Secondary | ICD-10-CM

## 2023-07-07 MED ORDER — IOPAMIDOL (ISOVUE-300) INJECTION 61%
75.0000 mL | Freq: Once | INTRAVENOUS | Status: AC | PRN
  Administered 2023-07-07: 75 mL via INTRAVENOUS

## 2023-12-19 ENCOUNTER — Ambulatory Visit

## 2023-12-19 DIAGNOSIS — Z860101 Personal history of adenomatous and serrated colon polyps: Secondary | ICD-10-CM | POA: Diagnosis not present

## 2023-12-19 DIAGNOSIS — K5730 Diverticulosis of large intestine without perforation or abscess without bleeding: Secondary | ICD-10-CM | POA: Diagnosis not present

## 2023-12-19 DIAGNOSIS — K635 Polyp of colon: Secondary | ICD-10-CM | POA: Diagnosis not present

## 2023-12-19 DIAGNOSIS — Z09 Encounter for follow-up examination after completed treatment for conditions other than malignant neoplasm: Secondary | ICD-10-CM | POA: Diagnosis present

## 2023-12-19 DIAGNOSIS — K640 First degree hemorrhoids: Secondary | ICD-10-CM | POA: Diagnosis not present

## 2023-12-19 DIAGNOSIS — K621 Rectal polyp: Secondary | ICD-10-CM | POA: Diagnosis not present
